# Patient Record
Sex: Male | Born: 1995 | Race: White | Hispanic: No | Marital: Single | Smoking: Never smoker
Health system: Southern US, Community
[De-identification: ages and names within clinical notes are randomized; demographics above are authoritative.]

## PROBLEM LIST (undated history)

## (undated) DIAGNOSIS — K2970 Gastritis, unspecified, without bleeding: Secondary | ICD-10-CM

## (undated) HISTORY — PX: ESOPHAGOGASTRODUODENOSCOPY ENDOSCOPY: SHX5814

---
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
## 2015-09-06 ENCOUNTER — Emergency Department (HOSPITAL_COMMUNITY)
Admission: EM | Admit: 2015-09-06 | Discharge: 2015-09-06 | Disposition: A | Discharge status: Home / Self Care | Payer: 59 | Attending: Emergency Medicine | Admitting: Emergency Medicine

## 2015-09-06 ENCOUNTER — Emergency Department (HOSPITAL_COMMUNITY): Payer: 59

## 2015-09-06 ENCOUNTER — Encounter (HOSPITAL_COMMUNITY): Payer: Self-pay | Admitting: *Deleted

## 2015-09-06 DIAGNOSIS — J0100 Acute maxillary sinusitis, unspecified: Secondary | ICD-10-CM

## 2015-09-06 DIAGNOSIS — Z792 Long term (current) use of antibiotics: Secondary | ICD-10-CM | POA: Diagnosis not present

## 2015-09-06 DIAGNOSIS — R0981 Nasal congestion: Secondary | ICD-10-CM | POA: Diagnosis present

## 2015-09-06 DIAGNOSIS — Z79899 Other long term (current) drug therapy: Secondary | ICD-10-CM | POA: Diagnosis not present

## 2015-09-06 DIAGNOSIS — K2970 Gastritis, unspecified, without bleeding: Secondary | ICD-10-CM | POA: Diagnosis not present

## 2015-09-06 LAB — CBC WITH DIFFERENTIAL/PLATELET
BASOS PCT: 1 %
Basophils Absolute: 0 10*3/uL (ref 0.0–0.1)
EOS ABS: 0.1 10*3/uL (ref 0.0–0.7)
Eosinophils Relative: 2 %
HEMATOCRIT: 46.6 % (ref 39.0–52.0)
HEMOGLOBIN: 16.5 g/dL (ref 13.0–17.0)
LYMPHS ABS: 2 10*3/uL (ref 0.7–4.0)
Lymphocytes Relative: 39 %
MCH: 30.1 pg (ref 26.0–34.0)
MCHC: 35.4 g/dL (ref 30.0–36.0)
MCV: 85 fL (ref 78.0–100.0)
MONO ABS: 0.6 10*3/uL (ref 0.1–1.0)
MONOS PCT: 12 %
NEUTROS PCT: 48 %
Neutro Abs: 2.5 10*3/uL (ref 1.7–7.7)
Platelets: 193 10*3/uL (ref 150–400)
RBC: 5.48 MIL/uL (ref 4.22–5.81)
RDW: 12.1 % (ref 11.5–15.5)
WBC: 5.3 10*3/uL (ref 4.0–10.5)

## 2015-09-06 LAB — LIPASE, BLOOD: Lipase: 25 U/L (ref 11–51)

## 2015-09-06 LAB — COMPREHENSIVE METABOLIC PANEL
ALBUMIN: 5 g/dL (ref 3.5–5.0)
ALK PHOS: 76 U/L (ref 38–126)
ALT: 16 U/L — ABNORMAL LOW (ref 17–63)
ANION GAP: 8 (ref 5–15)
AST: 21 U/L (ref 15–41)
BILIRUBIN TOTAL: 0.9 mg/dL (ref 0.3–1.2)
BUN: 17 mg/dL (ref 6–20)
CALCIUM: 9.8 mg/dL (ref 8.9–10.3)
CO2: 25 mmol/L (ref 22–32)
Chloride: 105 mmol/L (ref 101–111)
Creatinine, Ser: 1.09 mg/dL (ref 0.61–1.24)
GFR calc non Af Amer: >60 mL/min (ref >60)
Glucose, Bld: 97 mg/dL (ref 65–99)
POTASSIUM: 3.3 mmol/L — AB (ref 3.5–5.1)
SODIUM: 138 mmol/L (ref 135–145)
TOTAL PROTEIN: 8 g/dL (ref 6.5–8.1)

## 2015-09-06 MED ORDER — ONDANSETRON HCL 4 MG/2ML IJ SOLN
4.0000 mg | Freq: Once | INTRAMUSCULAR | Status: AC
  Administered 2015-09-06: 4 mg via INTRAVENOUS
  Filled 2015-09-06: qty 2

## 2015-09-06 MED ORDER — ONDANSETRON HCL 8 MG PO TABS: 8.0000 mg | ORAL_TABLET | ORAL | Status: DC | PRN

## 2015-09-06 MED ORDER — DOXYCYCLINE HYCLATE 100 MG PO CAPS: 100.0000 mg | ORAL_CAPSULE | Freq: Two times a day (BID) | ORAL | Status: DC

## 2015-09-06 MED ORDER — SODIUM CHLORIDE 0.9 % IV BOLUS (SEPSIS)
1000.0000 mL | Freq: Once | INTRAVENOUS | Status: AC
  Administered 2015-09-06: 1000 mL via INTRAVENOUS

## 2015-09-06 MED ORDER — METOCLOPRAMIDE HCL 5 MG/ML IJ SOLN
10.0000 mg | Freq: Once | INTRAMUSCULAR | Status: AC
  Administered 2015-09-06: 10 mg via INTRAVENOUS
  Filled 2015-09-06: qty 2

## 2015-09-06 MED ORDER — SODIUM CHLORIDE 0.9 % IV SOLN
Freq: Once | INTRAVENOUS | Status: AC
  Administered 2015-09-06: 10:00:00 via INTRAVENOUS

## 2015-09-06 MED ORDER — PANTOPRAZOLE SODIUM 20 MG PO TBEC: 20.0000 mg | DELAYED_RELEASE_TABLET | Freq: Two times a day (BID) | ORAL | Status: DC

## 2015-09-06 MED ORDER — PANTOPRAZOLE SODIUM 40 MG IV SOLR
40.0000 mg | Freq: Once | INTRAVENOUS | Status: AC
  Administered 2015-09-06: 40 mg via INTRAVENOUS
  Filled 2015-09-06: qty 40

## 2015-09-06 NOTE — ED Provider Notes (Signed)
CSN: [id number]     Arrival date & time 09/06/15  [id number] History   First MD Initiated Contact with Patient 09/06/15 [phone]     Chief Complaint  Patient presents with  . Nasal Congestion  . Emesis     (Consider location/radiation/quality/duration/timing/severity/associated sxs/prior Treatment) HPI...Marland Kitchen.Marland Kitchen.Patient presents with concerns of a "sinus infection" for 2 months. He was evaluated at the urgent care center on Wednesday and prescribed Augmentin. Since then he has been nauseated with vomiting. He is unable to keep fluids down. No fever, sweats, chills, stiff neck, neurodeficits. He is normally healthy. He feels dehydrated.  History reviewed. No pertinent past medical history. History reviewed. No pertinent past surgical history. No family history on file. Social History  Substance Use Topics  . Smoking status: Never Smoker   . Smokeless tobacco: None  . Alcohol Use: No    Review of Systems  All other systems reviewed and are negative.     Allergies  Review of patient's allergies indicates no known allergies.  Home Medications   Prior to Admission medications   Medication Sig Start Date End Date Taking? Authorizing Provider  amoxicillin-clavulanate (AUGMENTIN) 875-125 MG tablet Take 1 tablet by mouth 2 (two) times daily.   Yes Historical Provider, MD  doxycycline (VIBRAMYCIN) 100 MG capsule Take 1 capsule (100 mg total) by mouth 2 (two) times daily. 09/06/15   Donnetta HutchingBrian Nikitas Davtyan, MD  fluticasone [facility](FLONASE) 50 MCG/ACT nasal spray Place 1 spray into both nostrils daily as needed for allergies or rhinitis.   Yes Historical Provider, MD  guaiFENesin (MUCINEX) 600 MG 12 hr tablet Take 600 mg by mouth 2 (two) times daily.   Yes Historical Provider, MD  ondansetron (ZOFRAN) 8 MG tablet Take 1 tablet (8 mg total) by mouth every 4 (four) hours as needed. 09/06/15   Donnetta HutchingBrian Feiga Nadel, MD  pantoprazole (PROTONIX) 20 MG tablet Take 1 tablet (20 mg total) by mouth 2 (two) times daily. 09/06/15   Donnetta HutchingBrian Tashiya Souders, MD    BP 124/78 mmHg  Pulse 51  Temp(Src) 98.2 F (36.8 C) (Oral)  Resp 17  Ht 5\' 7"  (1.702 m)  Wt 173 lb (78.472 kg)  BMI 27.09 kg/m2  SpO2 99% Physical Exam  Constitutional: He is oriented to person, place, and time.  Alert, dry mucous membranes  HENT:  Head: Normocephalic and atraumatic.  Tender over bilateral maxillary sinuses  Eyes: Conjunctivae are normal.  Neck: Neck supple.  Cardiovascular: Normal rate and regular rhythm.   Pulmonary/Chest: Effort normal and breath sounds normal.  Abdominal: Soft. Bowel sounds are normal.  Minimal epigastric tenderness  Musculoskeletal: Normal range of motion.  Neurological: He is alert and oriented to person, place, and time.  Skin: Skin is warm and dry.  Psychiatric: He has a normal mood and affect. His behavior is normal.  Nursing note and vitals reviewed.   ED Course  Procedures (including critical care time) Labs Review Labs Reviewed  COMPREHENSIVE METABOLIC PANEL - Abnormal; Notable for the following:    Potassium 3.3 (*)    ALT 16 (*)    All other components within normal limits  CBC WITH DIFFERENTIAL/PLATELET  LIPASE, BLOOD    Imaging Review Dg Chest 2 View  09/06/2015  CLINICAL DATA:  Cough, fever, sinus infection for 2 months EXAM: CHEST  2 VIEW COMPARISON:  11/16/2008 FINDINGS: Normal heart size, mediastinal contours, and pulmonary vascularity. Lungs clear. No pleural effusion or pneumothorax. Bones unremarkable. IMPRESSION: Normal exam. Electronically Signed   By: Ulyses SouthwardMark  Boles M.D.   On:  09/06/2015 10:50   I have personally reviewed and evaluated these images and lab results as part of my medical decision-making.   EKG Interpretation None      MDM   Final diagnoses:  Gastritis  Acute maxillary sinusitis, recurrence not specified    I think  Augmentin has exacerbated gastritis symptoms. He feels better after IV fluids, IV Zofran, IV Protonix. Will change antibiotic from Augmentin to doxycycline 100 mg.  Also Rx  for Zofran 8 mg and protonix 20 mg twice a day.  This was discussed with the patient and his family.    Donnetta Hutching, MD 09/06/15 [phone]

## 2015-09-06 NOTE — Discharge Instructions (Signed)
Prescription for antibiotic, nausea medicine, antiulcer medication. Increase fluids. Return if worse.

## 2015-09-06 NOTE — ED Notes (Signed)
Pt comes in with nasal/chest congestion starting 2 months ago. Pt says this has progressively gotten worse. He went to urgent care on Wednesday and was prescribed augmentin and mucinex. Pt has been taking this but states he has been having episodes of vomiting in the morning shortly after getting up. Pt states his nausea gets better throughout the day.

## 2015-09-07 ENCOUNTER — Emergency Department (HOSPITAL_COMMUNITY): Payer: 59

## 2015-09-07 ENCOUNTER — Emergency Department (HOSPITAL_COMMUNITY)
Admission: EM | Admit: 2015-09-07 | Discharge: 2015-09-07 | Disposition: A | Discharge status: Home / Self Care | Payer: 59 | Attending: Emergency Medicine | Admitting: Emergency Medicine

## 2015-09-07 ENCOUNTER — Encounter (HOSPITAL_COMMUNITY): Payer: Self-pay | Admitting: Emergency Medicine

## 2015-09-07 DIAGNOSIS — Z79899 Other long term (current) drug therapy: Secondary | ICD-10-CM | POA: Insufficient documentation

## 2015-09-07 DIAGNOSIS — K2970 Gastritis, unspecified, without bleeding: Secondary | ICD-10-CM | POA: Insufficient documentation

## 2015-09-07 DIAGNOSIS — R1013 Epigastric pain: Secondary | ICD-10-CM | POA: Diagnosis present

## 2015-09-07 DIAGNOSIS — K2100 Gastro-esophageal reflux disease with esophagitis, without bleeding: Secondary | ICD-10-CM

## 2015-09-07 LAB — COMPREHENSIVE METABOLIC PANEL
ALK PHOS: 74 U/L (ref 38–126)
ALT: 16 U/L — AB (ref 17–63)
AST: 20 U/L (ref 15–41)
Albumin: 4.7 g/dL (ref 3.5–5.0)
Anion gap: 7 (ref 5–15)
BILIRUBIN TOTAL: 1.3 mg/dL — AB (ref 0.3–1.2)
BUN: 15 mg/dL (ref 6–20)
CO2: 25 mmol/L (ref 22–32)
Calcium: 9.6 mg/dL (ref 8.9–10.3)
Chloride: 106 mmol/L (ref 101–111)
Creatinine, Ser: 1.15 mg/dL (ref 0.61–1.24)
GLUCOSE: 98 mg/dL (ref 65–99)
Potassium: 3.8 mmol/L (ref 3.5–5.1)
Sodium: 138 mmol/L (ref 135–145)
TOTAL PROTEIN: 7.6 g/dL (ref 6.5–8.1)

## 2015-09-07 LAB — URINALYSIS, ROUTINE W REFLEX MICROSCOPIC
Bilirubin Urine: NEGATIVE
GLUCOSE, UA: NEGATIVE mg/dL
HGB URINE DIPSTICK: NEGATIVE
Ketones, ur: 15 mg/dL — AB
LEUKOCYTES UA: NEGATIVE
Nitrite: NEGATIVE
PH: 6.5 (ref 5.0–8.0)
PROTEIN: NEGATIVE mg/dL
Specific Gravity, Urine: <1.005 — ABNORMAL LOW (ref 1.005–1.030)

## 2015-09-07 LAB — CBC WITH DIFFERENTIAL/PLATELET
Basophils Absolute: 0 10*3/uL (ref 0.0–0.1)
Basophils Relative: 0 %
EOS PCT: 0 %
Eosinophils Absolute: 0 10*3/uL (ref 0.0–0.7)
HEMATOCRIT: 44.5 % (ref 39.0–52.0)
Hemoglobin: 15.7 g/dL (ref 13.0–17.0)
LYMPHS ABS: 1.4 10*3/uL (ref 0.7–4.0)
LYMPHS PCT: 26 %
MCH: 29.7 pg (ref 26.0–34.0)
MCHC: 35.3 g/dL (ref 30.0–36.0)
MCV: 84.3 fL (ref 78.0–100.0)
Monocytes Absolute: 0.4 10*3/uL (ref 0.1–1.0)
Monocytes Relative: 8 %
Neutro Abs: 3.6 10*3/uL (ref 1.7–7.7)
Neutrophils Relative %: 66 %
PLATELETS: 190 10*3/uL (ref 150–400)
RBC: 5.28 MIL/uL (ref 4.22–5.81)
RDW: 12 % (ref 11.5–15.5)
WBC: 5.4 10*3/uL (ref 4.0–10.5)

## 2015-09-07 LAB — LIPASE, BLOOD: Lipase: 21 U/L (ref 11–51)

## 2015-09-07 MED ORDER — PROMETHAZINE HCL 25 MG PO TABS: 25.0000 mg | ORAL_TABLET | Freq: Four times a day (QID) | ORAL | Status: DC | PRN

## 2015-09-07 MED ORDER — IOPAMIDOL (ISOVUE-300) INJECTION 61%
100.0000 mL | Freq: Once | INTRAVENOUS | Status: AC | PRN
  Administered 2015-09-07: 100 mL via INTRAVENOUS

## 2015-09-07 MED ORDER — SODIUM CHLORIDE 0.9 % IV BOLUS (SEPSIS)
1000.0000 mL | Freq: Once | INTRAVENOUS | Status: AC
  Administered 2015-09-07: 1000 mL via INTRAVENOUS

## 2015-09-07 MED ORDER — PANTOPRAZOLE SODIUM 40 MG IV SOLR
40.0000 mg | Freq: Once | INTRAVENOUS | Status: AC
  Administered 2015-09-07: 40 mg via INTRAVENOUS
  Filled 2015-09-07: qty 40

## 2015-09-07 MED ORDER — ONDANSETRON HCL 4 MG/2ML IJ SOLN
4.0000 mg | Freq: Once | INTRAMUSCULAR | Status: AC
  Administered 2015-09-07: 4 mg via INTRAVENOUS
  Filled 2015-09-07: qty 2

## 2015-09-07 MED ORDER — DIATRIZOATE MEGLUMINE & SODIUM 66-10 % PO SOLN
ORAL | Status: AC
  Filled 2015-09-07: qty 30

## 2015-09-07 MED ORDER — PROMETHAZINE HCL 25 MG/ML IJ SOLN
12.5000 mg | Freq: Once | INTRAMUSCULAR | Status: AC
  Administered 2015-09-07: 12.5 mg via INTRAVENOUS
  Filled 2015-09-07: qty 1

## 2015-09-07 NOTE — ED Provider Notes (Signed)
History  By signing my name below, I, Earmon Phoenix, attest that this documentation has been prepared under the direction and in the presence of Donnetta Hutching, MD. Electronically Signed: Earmon Phoenix, ED Scribe. 09/07/2015. 12:30 PM  Chief Complaint  Patient presents with  . Abdominal Pain   The history is provided by the patient and medical records. No language interpreter was used.    HPI Comments:  Jake Carr is a 20 y.o. male who presents to the Emergency Department complaining of nausea and epigastric pain that began again about 5 hours ago. He reports associated vomiting. He describes the pain as burning. Pt was evaluated here yesterday for nasal congestion, nausea and emesis. He was previously prescribed Augmentin but was changed to Doxycycline yesterday due to suspected gastritis exacerbation. Protonix and Zofran were also prescribed at yesterday's visit. Pt states he has been taking the medications as prescribed with the last dose being about four hours ago. He reports vomiting the medication up about an hour later. Pt states he was able to eat a small amount yesterday evening without vomiting. He denies modifying factors. He denies difficulty urinating, fever, chills, flank pain. Pt has an appt to establish care with a PCP, Dr. Rodrigo Ran, in four months because that was the first available appt.  History reviewed. No pertinent past medical history. History reviewed. No pertinent past surgical history. History reviewed. No pertinent family history. Social History  Substance Use Topics  . Smoking status: Never Smoker   . Smokeless tobacco: None  . Alcohol Use: No    Review of Systems A complete 10 system review of systems was obtained and all systems are negative except as noted in the HPI and PMH.   Allergies  Review of patient's allergies indicates no known allergies.  Home Medications   Prior to Admission medications   Medication Sig Start Date End Date  Taking? Authorizing Provider  amoxicillin-clavulanate (AUGMENTIN) 875-125 MG tablet Take 1 tablet by mouth 2 (two) times daily.   Yes Historical Provider, MD  doxycycline (VIBRAMYCIN) 100 MG capsule Take 1 capsule (100 mg total) by mouth 2 (two) times daily. 09/06/15  Yes Donnetta Hutching, MD  fluticasone [facility]) 50 MCG/ACT nasal spray Place 1 spray into both nostrils daily as needed for allergies or rhinitis.   Yes Historical Provider, MD  guaiFENesin (MUCINEX) 600 MG 12 hr tablet Take 600 mg by mouth 2 (two) times daily.   Yes Historical Provider, MD  ondansetron (ZOFRAN) 8 MG tablet Take 1 tablet (8 mg total) by mouth every 4 (four) hours as needed. 09/06/15  Yes Donnetta Hutching, MD  pantoprazole (PROTONIX) 20 MG tablet Take 1 tablet (20 mg total) by mouth 2 (two) times daily. 09/06/15  Yes Donnetta Hutching, MD  Probiotic Product ([facility] [employer] HEALTH PO) Take 1 capsule by mouth daily.   Yes Historical Provider, MD  promethazine (PHENERGAN) 25 MG tablet Take 1 tablet (25 mg total) by mouth every 6 (six) hours as needed. 09/07/15   Donnetta Hutching, MD   Triage Vitals: BP 134/87 mmHg  Pulse 64  Temp(Src) 97.7 F (36.5 C) (Oral)  Resp 18  SpO2 99% Physical Exam  Constitutional: He is oriented to person, place, and time. He appears well-developed and well-nourished.  HENT:  Head: Normocephalic and atraumatic.  Eyes: Conjunctivae are normal.  Neck: Neck supple.  Cardiovascular: Normal rate and regular rhythm.   Pulmonary/Chest: Effort normal and breath sounds normal.  Abdominal: Soft. Bowel sounds are normal. There is tenderness in the epigastric  area. There is no CVA tenderness.  Musculoskeletal: Normal range of motion.  Neurological: He is alert and oriented to person, place, and time.  Skin: Skin is warm and dry.  Psychiatric: He has a normal mood and affect. His behavior is normal.  Nursing note and vitals reviewed.   ED Course  Procedures (including critical care time) DIAGNOSTIC STUDIES: Oxygen  Saturation is 99% on RA, normal by my interpretation.   COORDINATION OF CARE: 10:18 AM- Will order IV fluids, labs and CT abdomen. Pt verbalizes understanding and agrees to plan.  Medications  diatrizoate meglumine-sodium (GASTROGRAFIN) 66-10 % solution (not administered)  sodium chloride 0.9 % bolus 1,000 mL (1,000 mLs Intravenous New Bag/Given 09/07/15 1053)  ondansetron (ZOFRAN) injection 4 mg (4 mg Intravenous Given 09/07/15 1053)  pantoprazole (PROTONIX) injection 40 mg (40 mg Intravenous Given 09/07/15 1053)  iopamidol (ISOVUE-300) 61 % injection 100 mL (100 mLs Intravenous Contrast Given 09/07/15 1154)  promethazine (PHENERGAN) injection 12.5 mg (12.5 mg Intravenous Given 09/07/15 1203)    Labs Review Labs Reviewed  COMPREHENSIVE METABOLIC PANEL - Abnormal; Notable for the following:    ALT 16 (*)    Total Bilirubin 1.3 (*)    All other components within normal limits  URINALYSIS, ROUTINE W REFLEX MICROSCOPIC (NOT AT [facility]) - Abnormal; Notable for the following:    Specific Gravity, Urine <1.005 (*)    Ketones, ur 15 (*)    All other components within normal limits  CBC WITH DIFFERENTIAL/PLATELET  LIPASE, BLOOD    Imaging Review Dg Chest 2 View  09/06/2015  CLINICAL DATA:  Cough, fever, sinus infection for 2 months EXAM: CHEST  2 VIEW COMPARISON:  11/16/2008 FINDINGS: Normal heart size, mediastinal contours, and pulmonary vascularity. Lungs clear. No pleural effusion or pneumothorax. Bones unremarkable. IMPRESSION: Normal exam. Electronically Signed   By: Ulyses Southward M.D.   On: 09/06/2015 10:50   Ct Abdomen Pelvis W Contrast  09/07/2015  CLINICAL DATA:  Epigastric pain with nausea and vomiting for 4 days EXAM: CT ABDOMEN AND PELVIS WITH CONTRAST TECHNIQUE: Multidetector CT imaging of the abdomen and pelvis was performed using the standard protocol following bolus administration of intravenous contrast. Oral contrast was also administered. CONTRAST:  ISOVUE-300 IOPAMIDOL (ISOVUE-300)  INJECTION 61% COMPARISON:  Abdominal ultrasound November 22, 2008 FINDINGS: Lower chest:  Lung bases are clear. Hepatobiliary: No focal liver lesions are evident. Gallbladder wall is not appreciably thickened. There is no biliary duct dilatation. Pancreas: There is no pancreatic mass or inflammatory focus. Spleen: No adrenal lesions are evident. Adrenals/Urinary Tract: Adrenals appear normal bilaterally. Kidneys bilaterally show no appreciable mass or hydronephrosis on either side. There is no renal or ureteral calculus on either side. Urinary bladder is midline with wall thickness within normal limits. Stomach/Bowel: There are scattered diverticula throughout the colon without diverticulitis. There is no bowel wall or mesenteric thickening. No bowel obstruction. No free air or portal venous air. Vascular/Lymphatic: There is no abdominal aortic aneurysm. No vascular lesions are evident. There is no appreciable adenopathy in the abdomen or pelvis. Reproductive: Prostate and seminal vesicles appear unremarkable. There is no pelvic mass or pelvic fluid collection. Other: Appendix appears normal. There is no abscess or ascites in the abdomen or pelvis. Musculoskeletal: There are no blastic or lytic bone lesions. There is no intramuscular or abdominal wall lesion. IMPRESSION: Scattered colonic diverticula without diverticulitis. There is no bowel wall thickening or bowel obstruction. No abscess. Appendix appears normal. No renal or ureteral calculus. No hydronephrosis. A cause for  patient's symptoms has not been established with this study. Electronically Signed   By: Bretta BangWilliam  Woodruff III M.D.   On: 09/07/2015 12:06   I have personally reviewed and evaluated these images and lab results as part of my medical decision-making.   EKG Interpretation None      MDM   Final diagnoses:  Gastritis  Gastroesophageal reflux disease with esophagitis    Patient is hemodynamically stable. Labs are pristine. CT scan  shows no acute findings. History and physical consistent with gastritis and GERD. Recommend Phenergan 25 mg in lieu of Zofran. Will follow-up with gastroenterologist. Discussed with patient and his parents  I personally performed the services described in this documentation, which was scribed in my presence. The recorded information has been reviewed and is accurate.      Donnetta HutchingBrian Cairo Agostinelli, MD 09/07/15 [phone]

## 2015-09-07 NOTE — ED Notes (Signed)
MD at bedside. 

## 2015-09-07 NOTE — Discharge Instructions (Signed)
Continue to take previous medications. Try Phenergan rather than Zofran. Follow-up with gastroenterologist if not getting better.

## 2015-09-07 NOTE — ED Notes (Signed)
PAtient complaining of nausea. Vo given from Dr. Adriana Simasook.

## 2015-09-07 NOTE — ED Notes (Signed)
Patient c/o increase in abd pain and no improvement in vomiting after being seen here in ER yesterday. Per patient took zofran and still had no relief in vomiting.

## 2015-09-07 NOTE — ED Notes (Signed)
Drinking po contrast. Tolerating well. 

## 2015-09-09 ENCOUNTER — Other Ambulatory Visit: Payer: Self-pay | Admitting: Gastroenterology

## 2015-09-09 DIAGNOSIS — R1013 Epigastric pain: Secondary | ICD-10-CM

## 2015-09-09 DIAGNOSIS — R110 Nausea: Secondary | ICD-10-CM

## 2015-09-12 ENCOUNTER — Ambulatory Visit
Admission: RE | Admit: 2015-09-12 | Discharge: 2015-09-12 | Disposition: A | Discharge status: Home / Self Care | Payer: 59 | Source: Ambulatory Visit | Attending: Gastroenterology | Admitting: Gastroenterology

## 2015-09-12 DIAGNOSIS — R1013 Epigastric pain: Secondary | ICD-10-CM

## 2015-09-12 DIAGNOSIS — R110 Nausea: Secondary | ICD-10-CM

## 2015-09-15 ENCOUNTER — Emergency Department (HOSPITAL_COMMUNITY): Payer: 59

## 2015-09-15 ENCOUNTER — Encounter (HOSPITAL_COMMUNITY): Payer: Self-pay | Admitting: Emergency Medicine

## 2015-09-15 ENCOUNTER — Emergency Department (HOSPITAL_COMMUNITY)
Admission: EM | Admit: 2015-09-15 | Discharge: 2015-09-15 | Disposition: A | Discharge status: Home / Self Care | Payer: 59 | Attending: Emergency Medicine | Admitting: Emergency Medicine

## 2015-09-15 DIAGNOSIS — F419 Anxiety disorder, unspecified: Secondary | ICD-10-CM | POA: Diagnosis not present

## 2015-09-15 DIAGNOSIS — Z79899 Other long term (current) drug therapy: Secondary | ICD-10-CM | POA: Diagnosis not present

## 2015-09-15 DIAGNOSIS — Z792 Long term (current) use of antibiotics: Secondary | ICD-10-CM | POA: Diagnosis not present

## 2015-09-15 DIAGNOSIS — R112 Nausea with vomiting, unspecified: Secondary | ICD-10-CM | POA: Insufficient documentation

## 2015-09-15 DIAGNOSIS — R1013 Epigastric pain: Secondary | ICD-10-CM | POA: Diagnosis not present

## 2015-09-15 DIAGNOSIS — F1210 Cannabis abuse, uncomplicated: Secondary | ICD-10-CM | POA: Diagnosis not present

## 2015-09-15 HISTORY — DX: Gastritis, unspecified, without bleeding: K29.70

## 2015-09-15 LAB — URINALYSIS, ROUTINE W REFLEX MICROSCOPIC
BILIRUBIN URINE: NEGATIVE
GLUCOSE, UA: NEGATIVE mg/dL
HGB URINE DIPSTICK: NEGATIVE
Ketones, ur: 15 mg/dL — AB
Leukocytes, UA: NEGATIVE
Nitrite: NEGATIVE
Protein, ur: NEGATIVE mg/dL
SPECIFIC GRAVITY, URINE: 1.01 (ref 1.005–1.030)
pH: 6 (ref 5.0–8.0)

## 2015-09-15 LAB — RAPID URINE DRUG SCREEN, HOSP PERFORMED
AMPHETAMINES: NOT DETECTED
Barbiturates: NOT DETECTED
Benzodiazepines: NOT DETECTED
Cocaine: NOT DETECTED
OPIATES: NOT DETECTED
TETRAHYDROCANNABINOL: POSITIVE — AB

## 2015-09-15 LAB — COMPREHENSIVE METABOLIC PANEL
ALBUMIN: 5.2 g/dL — AB (ref 3.5–5.0)
ALT: 17 U/L (ref 17–63)
ANION GAP: 10 (ref 5–15)
AST: 24 U/L (ref 15–41)
Alkaline Phosphatase: 83 U/L (ref 38–126)
BUN: 18 mg/dL (ref 6–20)
CHLORIDE: 105 mmol/L (ref 101–111)
CO2: 23 mmol/L (ref 22–32)
Calcium: 10 mg/dL (ref 8.9–10.3)
Creatinine, Ser: 1.3 mg/dL — ABNORMAL HIGH (ref 0.61–1.24)
GFR calc non Af Amer: >60 mL/min (ref >60)
GLUCOSE: 95 mg/dL (ref 65–99)
POTASSIUM: 3.3 mmol/L — AB (ref 3.5–5.1)
SODIUM: 138 mmol/L (ref 135–145)
Total Bilirubin: 1.5 mg/dL — ABNORMAL HIGH (ref 0.3–1.2)
Total Protein: 8.2 g/dL — ABNORMAL HIGH (ref 6.5–8.1)

## 2015-09-15 LAB — LIPASE, BLOOD: LIPASE: 27 U/L (ref 11–51)

## 2015-09-15 LAB — CBC
HEMATOCRIT: 47.8 % (ref 39.0–52.0)
HEMOGLOBIN: 17.4 g/dL — AB (ref 13.0–17.0)
MCH: 29.8 pg (ref 26.0–34.0)
MCHC: 36.4 g/dL — AB (ref 30.0–36.0)
MCV: 81.8 fL (ref 78.0–100.0)
PLATELETS: 212 10*3/uL (ref 150–400)
RBC: 5.84 MIL/uL — AB (ref 4.22–5.81)
RDW: 12.2 % (ref 11.5–15.5)
WBC: 7.3 10*3/uL (ref 4.0–10.5)

## 2015-09-15 LAB — D-DIMER, QUANTITATIVE: D-Dimer, Quant: 0.35 ug/mL-FEU (ref 0.00–0.50)

## 2015-09-15 LAB — CK: CK TOTAL: 102 U/L (ref 49–397)

## 2015-09-15 LAB — TROPONIN I

## 2015-09-15 MED ORDER — LORAZEPAM 2 MG/ML IJ SOLN
1.0000 mg | Freq: Once | INTRAMUSCULAR | Status: AC
  Administered 2015-09-15: 1 mg via INTRAVENOUS
  Filled 2015-09-15: qty 1

## 2015-09-15 MED ORDER — SODIUM CHLORIDE 0.9 % IV BOLUS (SEPSIS)
1000.0000 mL | Freq: Once | INTRAVENOUS | Status: AC
  Administered 2015-09-15: 1000 mL via INTRAVENOUS

## 2015-09-15 MED ORDER — ONDANSETRON HCL 4 MG/2ML IJ SOLN
4.0000 mg | Freq: Once | INTRAMUSCULAR | Status: AC
  Administered 2015-09-15: 4 mg via INTRAVENOUS
  Filled 2015-09-15: qty 2

## 2015-09-15 MED ORDER — PANTOPRAZOLE SODIUM 20 MG PO TBEC: 20.0000 mg | DELAYED_RELEASE_TABLET | Freq: Two times a day (BID) | ORAL | Status: DC

## 2015-09-15 MED ORDER — FAMOTIDINE IN NACL 20-0.9 MG/50ML-% IV SOLN
20.0000 mg | Freq: Two times a day (BID) | INTRAVENOUS | Status: DC
  Administered 2015-09-15: 20 mg via INTRAVENOUS
  Filled 2015-09-15: qty 50

## 2015-09-15 MED ORDER — LORAZEPAM 1 MG PO TABS: 1.0000 mg | ORAL_TABLET | Freq: Three times a day (TID) | ORAL | Status: DC | PRN

## 2015-09-15 MED ORDER — GI COCKTAIL ~~LOC~~
30.0000 mL | Freq: Once | ORAL | Status: AC
  Administered 2015-09-15: 30 mL via ORAL
  Filled 2015-09-15: qty 30

## 2015-09-15 NOTE — ED Provider Notes (Addendum)
CSN: [id number]     Arrival date & time 09/15/15  1615 History   First MD Initiated Contact with Patient 09/15/15 1804     Chief Complaint  Patient presents with  . Emesis     (Consider location/radiation/quality/duration/timing/severity/associated sxs/prior Treatment) HPI Comments: Patient here with parents. They state he has been unwell since approximately July 5 with epigastric pain nausea and vomiting. He's been seen in the emergency Department as well as the gastroenterology office and has had a negative workup including CT scan and ultrasound. Had an EGD 4 days ago that showed gastritis and is scheduled for a HIDA scan tomorrow. Patient reports ongoing epigastric pain rating to the chest associated with nausea and vomiting and unable to tolerate any medications at home. Has had intermittent green loose stools. Everything started when he was prescribed Augmentin for sinusitis by urgent care which was changed to doxycycline during his last ED visit. He has 3 doxycycline pills left. Patient reports is only able to eat a small amount of food without vomiting. His father reports he has continued to vomit phlegm. There is no known fever, chills or flank pain. He does not have a PCP but has seen Dr. Loreta Ave of gastroenterology. He endorses intermittent marijuana use last used about one week ago. Recently discharged from the [facility] but has not been out of the country in the past 8 months. Denies any regular alcohol use. No illicit pill or injection drug use.  Patient is a 20 y.o. male presenting with vomiting. The history is provided by the patient and a relative.  Emesis Associated symptoms: abdominal pain   Associated symptoms: no arthralgias, no diarrhea and no myalgias     Past Medical History  Diagnosis Date  . Gastritis    Past Surgical History  Procedure Laterality Date  . Esophagogastroduodenoscopy endoscopy     History reviewed. No pertinent family history. Social History   Substance Use Topics  . Smoking status: Never Smoker   . Smokeless tobacco: None  . Alcohol Use: No    Review of Systems  Constitutional: Positive for activity change, appetite change and fatigue. Negative for fever.  HENT: Negative for drooling and rhinorrhea.   Respiratory: Negative for cough, chest tightness and shortness of breath.   Cardiovascular: Negative for chest pain and palpitations.  Gastrointestinal: Positive for nausea, vomiting and abdominal pain. Negative for diarrhea.  Genitourinary: Negative for testicular pain.  Musculoskeletal: Negative for myalgias and arthralgias.  A complete 10 system review of systems was obtained and all systems are negative except as noted in the HPI and PMH.      Allergies  Review of patient's allergies indicates no known allergies.  Home Medications   Prior to Admission medications   Medication Sig Start Date End Date Taking? Authorizing Provider  doxycycline (VIBRAMYCIN) 100 MG capsule Take 1 capsule (100 mg total) by mouth 2 (two) times daily. 09/06/15  Yes Donnetta Hutching, MD  fluticasone [facility]) 50 MCG/ACT nasal spray Place 1 spray into both nostrils daily as needed for allergies or rhinitis.   Yes Historical Provider, MD  guaiFENesin (MUCINEX) 600 MG 12 hr tablet Take 600 mg by mouth 2 (two) times daily.   Yes Historical Provider, MD  ondansetron (ZOFRAN-ODT) 8 MG disintegrating tablet Take 8 mg by mouth every 8 (eight) hours as needed for nausea or vomiting.   Yes Historical Provider, MD  pantoprazole (PROTONIX) 20 MG tablet Take 1 tablet (20 mg total) by mouth 2 (two) times daily. 09/06/15  Yes Donnetta Hutching, MD  Probiotic Product [facility] IMMUNE HEALTH PO) Take 1 capsule by mouth daily. UltraFlora IB   Yes Historical Provider, MD  amoxicillin-clavulanate (AUGMENTIN) 875-125 MG tablet Take 1 tablet by mouth 2 (two) times daily.    Historical Provider, MD  ondansetron (ZOFRAN) 8 MG tablet Take 1 tablet (8 mg total) by mouth every 4 (four)  hours as needed. Patient not taking: Reported on 09/15/2015 09/06/15   Donnetta Hutching, MD  promethazine (PHENERGAN) 25 MG tablet Take 1 tablet (25 mg total) by mouth every 6 (six) hours as needed. Patient not taking: Reported on 09/15/2015 09/07/15   Donnetta Hutching, MD   BP 154/114 mmHg  Pulse 135  Temp(Src) 98.5 F (36.9 C) (Oral)  Resp 20  Ht  (1.702 m)  Wt 156 lb (70.761 kg)  BMI 24.43 kg/m2  SpO2 99% Physical Exam  Constitutional: He is oriented to person, place, and time. He appears well-developed and well-nourished. No distress.  Anxious appearing, tachycardic flushed  HENT:  Head: Normocephalic and atraumatic.  Mouth/Throat: Oropharynx is clear and moist. No oropharyngeal exudate.  Eyes: Conjunctivae and EOM are normal. Pupils are equal, round, and reactive to light.  Neck: Normal range of motion. Neck supple.  No meningismus.  Cardiovascular: Normal rate, regular rhythm, normal heart sounds and intact distal pulses.   No murmur heard. Tachycardic 100-130s, variable  Pulmonary/Chest: Effort normal and breath sounds normal. No respiratory distress. He exhibits no tenderness.  Abdominal: Soft. There is tenderness. There is no rebound and no guarding.  Epigastric tenderness, no guarding or rebound  Musculoskeletal: Normal range of motion. He exhibits no edema or tenderness.  Neurological: He is alert and oriented to person, place, and time. No cranial nerve deficit. He exhibits normal muscle tone. Coordination normal.  No ataxia on finger to nose bilaterally. No pronator drift. 5/5 strength throughout. CN 2-12 intact.Equal grip strength. Sensation intact.   Skin: Skin is warm.  Psychiatric: He has a normal mood and affect. His behavior is normal.  Nursing note and vitals reviewed.   ED Course  Procedures (including critical care time) Labs Review Labs Reviewed  COMPREHENSIVE METABOLIC PANEL - Abnormal; Notable for the following:    Potassium 3.3 (*)    Creatinine, Ser 1.30 (*)     Total Protein 8.2 (*)    Albumin 5.2 (*)    Total Bilirubin 1.5 (*)    All other components within normal limits  CBC - Abnormal; Notable for the following:    RBC 5.84 (*)    Hemoglobin 17.4 (*)    MCHC 36.4 (*)    All other components within normal limits  LIPASE, BLOOD  URINALYSIS, ROUTINE W REFLEX MICROSCOPIC (NOT AT [facility])  URINE RAPID DRUG SCREEN, HOSP PERFORMED    Imaging Review Dg Abd Acute W/chest  09/15/2015  CLINICAL DATA:  Acute abdominal pain and vomiting for 2 weeks. EXAM: DG ABDOMEN ACUTE W/ 1V CHEST COMPARISON:  09/06/2015, 09/07/2015, 09/12/2015 FINDINGS: There is no evidence of dilated bowel loops or free intraperitoneal air. No radiopaque calculi or other significant radiographic abnormality is seen. Heart size and mediastinal contours are within normal limits. Both lungs are clear. Minor scoliosis of the thoracolumbar junction, convex left. IMPRESSION: Negative abdominal radiographs.  No acute cardiopulmonary disease. Electronically Signed   By: Judie Petit.  Shick M.D.   On: 09/15/2015 18:43   I have personally reviewed and evaluated these images and lab results as part of my medical decision-making.   EKG Interpretation  Date/Time:  Monday September 15 2015 18:56:07 EDT Ventricular Rate:  94 PR Interval:    QRS Duration: 88 QT Interval:  376 QTC Calculation: 471 R Axis:   87 Text Interpretation:  Sinus arrhythmia Nonspecific T abnormalities,  inferior leads Borderline prolonged QT interval T waves now upright  laterally inferior T wave inversions and ST depressions Confirmed by  Manus GunningANCOUR  MD, Anjanae Woehrle [phone]([id number]) on 09/15/2015 7:49:24 PM      MDM   Final diagnoses:  Nausea and vomiting, vomiting of unspecified type  Anxiety  Marijuana abuse   Ongoing epigastric pain with nausea and vomiting, recurrent issue. Patient appears dehydrated and tachycardic. Record review shows negative abdominal ultrasound done July 14. Patient reports EGD showed only gastritis. CT scan done  on July 8 was negative for acute pathology.   Patient anxious and tachycardic. Labs showed hemoconcentration and elevated creatinine. IV fluids given with subsequent symptom control.  EKG obtained and shows ST depression and T-wave inversions inferior laterally. No comparison. Denies chest pain. Patient does admit to marijuana use 2 days ago  Patient very anxious and tachycardic. Questionable whether he could be withdrawing from some substance. Denies any benzodiazepine use or excessive alcohol use. States he only drinks occasionally. Admits to only marijuana drug use. Confirms this with parents out of room.  Screen only positive for marijuana. D-dimer negative. TSH normal. Doubt thyroid storm.  EKG reviewed with cardiology fellow Dr. Tarri GlennAzeem.  No comparison. He does not feel this represents ischemia. He states this could be rate related nonspecific T-wave inversions. Or this could be normal variant. There is no evidence of prolonged QT or Brugada syndrome. He does not feel patient needs any further cardiac evaluation with negative troponin. EKG does not suggest HCOM.  Patient tolerating by mouth. No further vomiting. Remains tachycardic and hypertensive and anxious appearing though improved with Ativan. Denies any alcohol or benzodiazepine withdrawal. Denies any other illicit drug use.  Patient now admits that [facility] due to [facility]. However he has not ever been any anxiety medications. Denies any suicidal or homicidal thoughts. Adamantly denies any alcohol or illicit drug use.  Advised to discontinue marijuana use. Follow up with GI as scheduled. He has PCP follow-up on July 31. Suspect his tachycardia may be due to his underlying anxiety. Heart rate has improved to 100 when he is resting. Elevates to 130s when he is talking with staff. Interestingly his heart rate was normal on his previous ED visits this month. Question whether marijuana was laced with another  substance. He is comfortable going home and wishes to do so. He is not tremulous. Doubt life threatening alcohol withdrawal as patient continues to deny any regular alcohol or benzo use.  Last drink "3 months ago".  Has HIDA scan tomorrow.  Will refill PPI. Will given short supply of ativan. Stop doxycycline. Needs to establish with PCP. D/c marijuana use.  Return precautions discussed.  Glynn OctaveStephen Rennie Rouch, MD 09/15/15 [id number]  Glynn OctaveStephen Lexxie Winberg, MD 09/16/15 1113

## 2015-09-15 NOTE — ED Notes (Signed)
Notified by registration that patient's father came to window and states patient feels like he's going to pass out. Bringing patient into triage to obtain new vital signs.

## 2015-09-15 NOTE — ED Notes (Signed)
Patient states he is still vomiting since last visit here earlier this month.

## 2015-09-15 NOTE — Discharge Instructions (Signed)
Nausea and Vomiting Follow up with Dr. Loreta Ave and Dr. Waynard Edwards as scheduled. Stop using marijuana. Return to the ED if you develop new or worsening symptoms. Nausea is a sick feeling that often comes before throwing up (vomiting). Vomiting is a reflex where stomach contents come out of your mouth. Vomiting can cause severe loss of body fluids (dehydration). Children and elderly adults can become dehydrated quickly, especially if they also have diarrhea. Nausea and vomiting are symptoms of a condition or disease. It is important to find the cause of your symptoms. CAUSES   Direct irritation of the stomach lining. This irritation can result from increased acid production (gastroesophageal reflux disease), infection, food poisoning, taking certain medicines (such as nonsteroidal anti-inflammatory drugs), alcohol use, or tobacco use.  Signals from the brain.These signals could be caused by a headache, heat exposure, an inner ear disturbance, increased pressure in the brain from injury, infection, a tumor, or a concussion, pain, emotional stimulus, or metabolic problems.  An obstruction in the gastrointestinal tract (bowel obstruction).  Illnesses such as diabetes, hepatitis, gallbladder problems, appendicitis, kidney problems, cancer, sepsis, atypical symptoms of a heart attack, or eating disorders.  Medical treatments such as chemotherapy and radiation.  Receiving medicine that makes you sleep (general anesthetic) during surgery. DIAGNOSIS Your caregiver may ask for tests to be done if the problems do not improve after a few days. Tests may also be done if symptoms are severe or if the reason for the nausea and vomiting is not clear. Tests may include:  Urine tests.  Blood tests.  Stool tests.  Cultures (to look for evidence of infection).  X-rays or other imaging studies. Test results can help your caregiver make decisions about treatment or the need for additional tests. TREATMENT You need  to stay well hydrated. Drink frequently but in small amounts.You may wish to drink water, sports drinks, clear broth, or eat frozen ice pops or gelatin dessert to help stay hydrated.When you eat, eating slowly may help prevent nausea.There are also some antinausea medicines that may help prevent nausea. HOME CARE INSTRUCTIONS   Take all medicine as directed by your caregiver.  If you do not have an appetite, do not force yourself to eat. However, you must continue to drink fluids.  If you have an appetite, eat a normal diet unless your caregiver tells you differently.  Eat a variety of complex carbohydrates (rice, wheat, potatoes, bread), lean meats, yogurt, fruits, and vegetables.  Avoid high-fat foods because they are more difficult to digest.  Drink enough water and fluids to keep your urine clear or pale yellow.  If you are dehydrated, ask your caregiver for specific rehydration instructions. Signs of dehydration may include:  Severe thirst.  Dry lips and mouth.  Dizziness.  Dark urine.  Decreasing urine frequency and amount.  Confusion.  Rapid breathing or pulse. SEEK IMMEDIATE MEDICAL CARE IF:   You have blood or brown flecks (like coffee grounds) in your vomit.  You have black or bloody stools.  You have a severe headache or stiff neck.  You are confused.  You have severe abdominal pain.  You have chest pain or trouble breathing.  You do not urinate at least once every 8 hours.  You develop cold or clammy skin.  You continue to vomit for longer than 24 to 48 hours.  You have a fever. MAKE SURE YOU:   Understand these instructions.  Will watch your condition.  Will get help right away if you are not doing  well or get worse.   This information is not intended to replace advice given to you by your health care provider. Make sure you discuss any questions you have with your health care provider.   Document Released: 02/15/2005 Document Revised:  05/10/2011 Document Reviewed: 07/15/2010 Elsevier Interactive Patient Education 2016 [employer]  [employer] CorporationCommunity Resource Guide Outpatient Counseling/Substance Abuse Adult The United Ways 211 is a great source of information about community services available.  Access by dialing 2-1-1 from anywhere in [place], or by website -  PooledIncome.plwww.nc211.org.   Other Local Resources (Updated 03/2015)  Crisis Hotlines   Services     Area Served  [facility]  Crisis Hotline, available 24 hours a day, 7 days a week: ([phone] [facility], [place]   [facility]  Crisis Hotline, available 24 hours a day, 7 days a week: [phone] [facility], [place]  [facility]  Suicide Prevention Hotline, available 24 hours a day, 7 days a week: 504-411-6682 [facility], [place]  [facility]   Crisis Hotline, available 24 hours a day, 7 days a week: [phone] [facility], [place]   [facility] Access to [employer] Line  Crisis Hotline, available 24 hours a day, 7 days a week: [phone] All   [facility]  Crisis Hotline, available 24 hours a day, 7 days a week: 437-546-6106(603)646-1316 All   Other Local Resources (Updated 03/2015)  Outpatient Counseling/ Substance Abuse Programs  Services     Address and Phone Number  ADS (Alcohol and Drug Services)   Options include Individual counseling, group counseling, intensive outpatient program (several hours a day, several days a week)  Offers depression assessments  Provides methadone maintenance program [phone] [address], [address] [place], [place] [address]   [facility]   Offers partial hospitalization/day treatment and DUI/DWI programs  [employer]s Medicare, private insurance [phone] [address], [address] [place], [place] [address]  [facility]    Services include intensive outpatient program (several hours a day, several days a week), outpatient treatment, DUI/DWI  services, family education  Also has some services specifically for [employer]  Offers transitional housing  [phone] [address] [place], [place] [address]     [facility]  [employer]cepts Medicare, private pay, and private insurance [phone] [address], [address] [place], [place] [address]  [facility]  Services include individual counseling, substance abuse intensive outpatient program (several hours a day, several days a week), day treatment  Delene Lollccepts Medicare, Medicaid, private insurance [phone] [address], [address] [place], [place] [address]  [facility]   Offers substance abuse intensive outpatient program (several hours a day, several days a week), partial hospitalization program [phone] [address] [place], [place] [address]  [phone] [address] [place], [place] [address]  [phone] [address] [place], [place] [address]  [phone] [address] [place], [place] [address]  [facility]  Individual counseling only  Accepts private insurance only [phone] [address], [address] [place], [place] [address]  [facility]  Methadone maintenance program [phone] [address] [place], [place] [address]  [facility]  Walk-In Clinic providing substance abuse and mental health counseling  Accepts Medicaid, Medicare, private insurance  Offers sliding scale for uninsured [phone] [address] [place], [place]   [facility], [employer].  Offers individual counseling, and intensive in-home services [phone] [address], [address] [place], [place] [address]  [facility] individual counseling, family counseling, group therapy, domestic violence counseling, consumer credit counseling  Accepts Medicare, Medicaid, private insurance  Offers sliding scale  for uninsured [phone] [address] [place], [place] [address]  [phone] [facility], [address] [place], [place] [address]  [facility]  Offers individual, family and group counseling  3 locations - [place], [place], and [place]  [phone]  [address] [place], [place] [address]  [address] [place], [place] [address]  [address] [place], [place] [address]  [facility]    Offers psychiatric assessment, 8-week Intensive Outpatient Program (several hours a day, several times a week, daytime or evenings), early recovery group, family Program, medication management  Private pay or private insurance only [phone], or  [phone] [address] [place], [place] [address]  [facility] [place] [employer] individual, couples and family counseling  Accepts Medicaid, private insurance, and sliding scale for uninsured [phone] [address] [place], [place] [address]  Len Blalock, MD  Individual counseling  Private insurance [phone] [address] [place], [place] [address]  [facility]   Offers assessment, substance abuse treatment, and behavioral health treatment [phone] [address] [place], [place] [address]  [facility]  Individual counseling  Accepts private insurance [phone] [address] [place], [place] [address]  [facility]  Individual counseling  Delene Loll, private insurance [phone] [address] [place], [place] [address]  [facility]    Offers intensive outpatient program (several hours a day, several times a week)  Private pay, private insurance [phone] [facility] [place], [place]  [facility]  Individual counseling  Medicare, private insurance [phone] [address] [place], [place] [address]  [facility]     Offers intensive outpatient program (several hours a day, several times a week) and partial hospitalization program [phone] [address] [place], [place] [address]  Emerson Monte, MD  Individual counseling [phone] [address], [address] [place], [place] [address]  [facility]  Offers Christian counseling to individuals, couples, and families  Accepts Medicare and private insurance; offers sliding scale for uninsured [phone] [address] [place], [place] [address]  [facility]  [place] [facility] [phone] [address], [address] [place], [place] [address]  [facility] crisis counseling, individual counseling, group therapy, in-home therapy, domestic violence services, day treatment, DWI services, Administrator, arts (CST), Assertive Community Treatment Team (ACTT), substance abuse Intensive Outpatient Program (several hours a day, several times a week)  2 locations - [place] and [place] [phone] [address] [place], [place] [address]  [phone] [address] [place], [place] [address]  [facility]     Individual counseling and group therapy  Accepts private insurance, [place], [place] [phone] [address] [place], [place]  [facility]  Offers individual and family counseling  Offers LGBTQ services  Accepts private insurance and private pay [phone] [address] [place], [place] [address]  [facility]    Offers individual counseling, group therapy, and outpatient detox  Accepts private insurance [phone] [address] [place], [place]  [facility]  Individual counseling  Accepts Medicare, private insurance [phone] [address] [place], [place] [address]  [employer]  Individual counseling  [employer], private  insurance [phone] [address] [place], [place] [address]  [employer] [facility]   Offers  substance abuse Intensive Outpatient Program (several hours a day, several times a week) [phone], or [phone] [place], [place]

## 2015-09-16 ENCOUNTER — Encounter (HOSPITAL_COMMUNITY)
Admission: RE | Admit: 2015-09-16 | Discharge: 2015-09-16 | Disposition: A | Discharge status: Home / Self Care | Payer: 59 | Source: Ambulatory Visit | Attending: Gastroenterology | Admitting: Gastroenterology

## 2015-09-16 ENCOUNTER — Encounter (HOSPITAL_COMMUNITY): Payer: Self-pay

## 2015-09-16 DIAGNOSIS — R110 Nausea: Secondary | ICD-10-CM | POA: Insufficient documentation

## 2015-09-16 DIAGNOSIS — R1013 Epigastric pain: Secondary | ICD-10-CM | POA: Diagnosis present

## 2015-09-16 LAB — TSH: TSH: 0.746 u[IU]/mL (ref 0.350–4.500)

## 2015-09-16 MED ORDER — TECHNETIUM TC 99M MEBROFENIN IV KIT
5.0000 | PACK | Freq: Once | INTRAVENOUS | Status: AC | PRN
  Administered 2015-09-16: 5 via INTRAVENOUS

## 2015-09-17 ENCOUNTER — Encounter (HOSPITAL_COMMUNITY): Payer: Self-pay

## 2015-09-17 ENCOUNTER — Observation Stay (HOSPITAL_COMMUNITY)
Admission: EM | Admit: 2015-09-17 | Discharge: 2015-09-19 | Disposition: A | Discharge status: Home / Self Care | Payer: 59 | Attending: Internal Medicine | Admitting: Internal Medicine

## 2015-09-17 DIAGNOSIS — K209 Esophagitis, unspecified: Secondary | ICD-10-CM | POA: Diagnosis not present

## 2015-09-17 DIAGNOSIS — D751 Secondary polycythemia: Secondary | ICD-10-CM | POA: Insufficient documentation

## 2015-09-17 DIAGNOSIS — R1013 Epigastric pain: Secondary | ICD-10-CM | POA: Diagnosis present

## 2015-09-17 DIAGNOSIS — F419 Anxiety disorder, unspecified: Secondary | ICD-10-CM | POA: Insufficient documentation

## 2015-09-17 DIAGNOSIS — R1110 Vomiting, unspecified: Secondary | ICD-10-CM | POA: Diagnosis present

## 2015-09-17 DIAGNOSIS — K2970 Gastritis, unspecified, without bleeding: Secondary | ICD-10-CM | POA: Diagnosis not present

## 2015-09-17 DIAGNOSIS — K828 Other specified diseases of gallbladder: Secondary | ICD-10-CM | POA: Diagnosis not present

## 2015-09-17 DIAGNOSIS — R112 Nausea with vomiting, unspecified: Secondary | ICD-10-CM | POA: Diagnosis not present

## 2015-09-17 DIAGNOSIS — E860 Dehydration: Secondary | ICD-10-CM | POA: Diagnosis not present

## 2015-09-17 DIAGNOSIS — F1290 Cannabis use, unspecified, uncomplicated: Secondary | ICD-10-CM | POA: Insufficient documentation

## 2015-09-17 LAB — URINALYSIS, ROUTINE W REFLEX MICROSCOPIC
Glucose, UA: NEGATIVE mg/dL
Hgb urine dipstick: NEGATIVE
LEUKOCYTES UA: NEGATIVE
NITRITE: NEGATIVE
PH: 6.5 (ref 5.0–8.0)
PROTEIN: NEGATIVE mg/dL
Specific Gravity, Urine: 1.029 (ref 1.005–1.030)

## 2015-09-17 LAB — COMPREHENSIVE METABOLIC PANEL
ALT: 17 U/L (ref 17–63)
ANION GAP: 11 (ref 5–15)
AST: 27 U/L (ref 15–41)
Albumin: 5.3 g/dL — ABNORMAL HIGH (ref 3.5–5.0)
Alkaline Phosphatase: 84 U/L (ref 38–126)
BILIRUBIN TOTAL: 2.5 mg/dL — AB (ref 0.3–1.2)
BUN: 14 mg/dL (ref 6–20)
CHLORIDE: 109 mmol/L (ref 101–111)
CO2: 19 mmol/L — ABNORMAL LOW (ref 22–32)
Calcium: 10.5 mg/dL — ABNORMAL HIGH (ref 8.9–10.3)
Creatinine, Ser: 1.34 mg/dL — ABNORMAL HIGH (ref 0.61–1.24)
Glucose, Bld: 102 mg/dL — ABNORMAL HIGH (ref 65–99)
POTASSIUM: 3.9 mmol/L (ref 3.5–5.1)
Sodium: 139 mmol/L (ref 135–145)
TOTAL PROTEIN: 7.9 g/dL (ref 6.5–8.1)

## 2015-09-17 LAB — CBC
HEMATOCRIT: 47 % (ref 39.0–52.0)
Hemoglobin: 17.1 g/dL — ABNORMAL HIGH (ref 13.0–17.0)
MCH: 30.2 pg (ref 26.0–34.0)
MCHC: 36.4 g/dL — ABNORMAL HIGH (ref 30.0–36.0)
MCV: 82.9 fL (ref 78.0–100.0)
PLATELETS: 233 10*3/uL (ref 150–400)
RBC: 5.67 MIL/uL (ref 4.22–5.81)
RDW: 12.2 % (ref 11.5–15.5)
WBC: 7.1 10*3/uL (ref 4.0–10.5)

## 2015-09-17 LAB — URINE MICROSCOPIC-ADD ON: WBC UA: NONE SEEN WBC/hpf (ref 0–5)

## 2015-09-17 LAB — LIPASE, BLOOD: LIPASE: 27 U/L (ref 11–51)

## 2015-09-17 MED ORDER — SODIUM CHLORIDE 0.9 % IV BOLUS (SEPSIS)
1000.0000 mL | Freq: Once | INTRAVENOUS | Status: AC
Start: 2015-09-17 — End: 2015-09-17
  Administered 2015-09-17: 1000 mL via INTRAVENOUS

## 2015-09-17 MED ORDER — SODIUM CHLORIDE 0.9 % IV BOLUS (SEPSIS)
1000.0000 mL | Freq: Once | INTRAVENOUS | Status: AC
  Administered 2015-09-17: 1000 mL via INTRAVENOUS

## 2015-09-17 MED ORDER — SODIUM CHLORIDE 0.9 % IV SOLN: INTRAVENOUS | Status: DC

## 2015-09-17 MED ORDER — ONDANSETRON HCL 4 MG/2ML IJ SOLN
4.0000 mg | Freq: Four times a day (QID) | INTRAMUSCULAR | Status: DC | PRN
Start: 2015-09-17 — End: 2015-09-19
  Administered 2015-09-18 – 2015-09-19 (×4): 4 mg via INTRAVENOUS
  Filled 2015-09-17 (×3): qty 2

## 2015-09-17 MED ORDER — SODIUM CHLORIDE 0.9 % IV SOLN
INTRAVENOUS | Status: DC
  Administered 2015-09-17 – 2015-09-19 (×3): via INTRAVENOUS

## 2015-09-17 MED ORDER — HYDROCODONE-ACETAMINOPHEN 5-325 MG PO TABS: 1.0000 | ORAL_TABLET | ORAL | Status: DC | PRN

## 2015-09-17 MED ORDER — ONDANSETRON HCL 4 MG/2ML IJ SOLN: 4.0000 mg | Freq: Three times a day (TID) | INTRAMUSCULAR | Status: AC | PRN

## 2015-09-17 MED ORDER — PROMETHAZINE HCL 25 MG/ML IJ SOLN: 25.0000 mg | Freq: Once | INTRAMUSCULAR | Status: DC

## 2015-09-17 MED ORDER — PANTOPRAZOLE SODIUM 40 MG PO TBEC
40.0000 mg | DELAYED_RELEASE_TABLET | Freq: Every day | ORAL | Status: DC
  Administered 2015-09-17 – 2015-09-18 (×2): 40 mg via ORAL
  Filled 2015-09-17 (×3): qty 1

## 2015-09-17 MED ORDER — ONDANSETRON HCL 4 MG PO TABS: 4.0000 mg | ORAL_TABLET | Freq: Four times a day (QID) | ORAL | Status: DC | PRN

## 2015-09-17 NOTE — ED Provider Notes (Signed)
CSN: [id number]     Arrival date & time 09/17/15  1246 History   First MD Initiated Contact with Patient 09/17/15 1503     Chief Complaint  Patient presents with  . epigastric pain   . Emesis    (Consider location/radiation/quality/duration/timing/severity/associated sxs/prior Treatment) HPI Comments: Patient presents with three-week history of continued epigastric pain, vomiting, diarrhea. Patient has had 4 ED visits for the same. He has seen gastroenterologist, Dr. [name], who has performed an EGD showing gastritis. Patient has had a HIDA scan which showed a low normal ejection fraction of 35%. Symptoms at home are uncontrolled despite oral Phenergan, ODT Zofran. Symptoms were uncontrolled today, > 10 episodes of vomting, and patient was concerned about dehydration prompting ED visit. Upcoming surgery appointment to evaluate gallbladder. The onset of this condition was acute. The course is constant. Aggravating factors: none. Alleviating factors: none. Denies recent suspicious water exposures. History of marijuana use.    Patient is a 20 y.o. male presenting with vomiting. The history is provided by the patient and medical records.  Emesis Associated symptoms: abdominal pain and diarrhea   Associated symptoms: no headaches, no myalgias and no sore throat     Past Medical History  Diagnosis Date  . Gastritis    Past Surgical History  Procedure Laterality Date  . Esophagogastroduodenoscopy endoscopy     No family history on file. Social History  Substance Use Topics  . Smoking status: Never Smoker   . Smokeless tobacco: None  . Alcohol Use: No    Review of Systems  Constitutional: Negative for fever.  HENT: Negative for rhinorrhea and sore throat.   Eyes: Negative for redness.  Respiratory: Negative for cough.   Cardiovascular: Negative for chest pain.  Gastrointestinal: Positive for nausea, vomiting, abdominal pain and diarrhea.  Genitourinary: Negative for dysuria.   Musculoskeletal: Negative for myalgias.  Skin: Negative for rash.  Neurological: Negative for headaches.    Allergies  Review of patient's allergies indicates no known allergies.  Home Medications   Prior to Admission medications   Medication Sig Start Date End Date Taking? Authorizing Provider  amoxicillin-clavulanate (AUGMENTIN) 875-125 MG tablet Take 1 tablet by mouth 2 (two) times daily.    Historical Provider, MD  doxycycline (VIBRAMYCIN) 100 MG capsule Take 1 capsule (100 mg total) by mouth 2 (two) times daily. 09/06/15   Donnetta HutchingBrian Cook, MD  fluticasone [facility](FLONASE) 50 MCG/ACT nasal spray Place 1 spray into both nostrils daily as needed for allergies or rhinitis.    Historical Provider, MD  guaiFENesin (MUCINEX) 600 MG 12 hr tablet Take 600 mg by mouth 2 (two) times daily.    Historical Provider, MD  LORazepam (ATIVAN) 1 MG tablet Take 1 tablet (1 mg total) by mouth 3 (three) times daily as needed for anxiety. 09/15/15   Glynn OctaveStephen Rancour, MD  ondansetron (ZOFRAN) 8 MG tablet Take 1 tablet (8 mg total) by mouth every 4 (four) hours as needed. Patient not taking: Reported on 09/15/2015 09/06/15   Donnetta HutchingBrian Cook, MD  ondansetron (ZOFRAN-ODT) 8 MG disintegrating tablet Take 8 mg by mouth every 8 (eight) hours as needed for nausea or vomiting.    Historical Provider, MD  pantoprazole (PROTONIX) 20 MG tablet Take 1 tablet (20 mg total) by mouth 2 (two) times daily. 09/15/15   Glynn OctaveStephen Rancour, MD  Probiotic Product [facility](ULTRAFLORA IMMUNE HEALTH PO) Take 1 capsule by mouth daily. UltraFlora IB    Historical Provider, MD  promethazine (PHENERGAN) 25 MG tablet Take 1 tablet (25 mg total)  by mouth every 6 (six) hours as needed. Patient not taking: Reported on 09/15/2015 09/07/15   Donnetta Hutching, MD   BP 140/100 mmHg  Pulse 70  Temp(Src) 98.5 F (36.9 C) (Oral)  Resp 18  Ht 5\' 7"  (1.702 m)  Wt 69.4 kg  BMI 23.96 kg/m2  SpO2 100%   Physical Exam  Constitutional: He appears well-developed and well-nourished.   HENT:  Head: Normocephalic and atraumatic.  Mouth/Throat: Oropharynx is clear and moist.  Eyes: Conjunctivae are normal. Right eye exhibits no discharge. Left eye exhibits no discharge.  Neck: Normal range of motion. Neck supple.  Cardiovascular: Normal rate, regular rhythm and normal heart sounds.   No murmur heard. Pulmonary/Chest: Effort normal and breath sounds normal. No respiratory distress. He has no wheezes. He has no rales.  Abdominal: Soft. There is no tenderness. There is no rebound and no guarding.  Neurological: He is alert.  Skin: Skin is warm and dry.  Psychiatric: He has a normal mood and affect.  Nursing note and vitals reviewed.   ED Course  Procedures (including critical care time) Labs Review Labs Reviewed  COMPREHENSIVE METABOLIC PANEL - Abnormal; Notable for the following:    CO2 19 (*)    Glucose, Bld 102 (*)    Creatinine, Ser 1.34 (*)    Calcium 10.5 (*)    Albumin 5.3 (*)    Total Bilirubin 2.5 (*)    All other components within normal limits  CBC - Abnormal; Notable for the following:    Hemoglobin 17.1 (*)    MCHC 36.4 (*)    All other components within normal limits  URINALYSIS, ROUTINE W REFLEX MICROSCOPIC (NOT AT [facility]) - Abnormal; Notable for the following:    APPearance TURBID (*)    Bilirubin Urine SMALL (*)    Ketones, ur >80 (*)    All other components within normal limits  URINE MICROSCOPIC-ADD ON - Abnormal; Notable for the following:    Squamous Epithelial / LPF 0-5 (*)    Bacteria, UA FEW (*)    All other components within normal limits  LIPASE, BLOOD    Imaging Review Nm Hepato W/eject Fract  09/16/2015  CLINICAL DATA:  Epigastric pain, nausea over the last 2 weeks EXAM: NUCLEAR MEDICINE HEPATOBILIARY IMAGING WITH GALLBLADDER EF TECHNIQUE: Sequential images of the abdomen were obtained out to 60 minutes following intravenous administration of radiopharmaceutical. After oral ingestion of Ensure, gallbladder ejection fraction was  determined. At 60 min, normal ejection fraction is greater than 33%. RADIOPHARMACEUTICALS:  5.0 mCi Tc-80m  Choletec IV COMPARISON:  CT abdomen pelvis of 09/07/2015 and ultrasound of the abdomen of 09/07/2015 FINDINGS: Prompt uptake and biliary excretion of activity by the liver is seen. Gallbladder activity is visualized, consistent with patency of cystic duct. Biliary activity passes into small bowel, consistent with patent common bile duct. Calculated gallbladder ejection fraction is 35%. (Normal gallbladder ejection fraction with Ensure is greater than 33%.) IMPRESSION: Normal nuclear medicine hepatobiliary scan. Low normal gallbladder ejection fraction of 35%. Electronically Signed   By: Dwyane Dee M.D.   On: 09/16/2015 11:39   Dg Abd Acute W/chest  09/15/2015  CLINICAL DATA:  Acute abdominal pain and vomiting for 2 weeks. EXAM: DG ABDOMEN ACUTE W/ 1V CHEST COMPARISON:  09/06/2015, 09/07/2015, 09/12/2015 FINDINGS: There is no evidence of dilated bowel loops or free intraperitoneal air. No radiopaque calculi or other significant radiographic abnormality is seen. Heart size and mediastinal contours are within normal limits. Both lungs are clear. Minor scoliosis  of the thoracolumbar junction, convex left. IMPRESSION: Negative abdominal radiographs.  No acute cardiopulmonary disease. Electronically Signed   By: Judie Petit.  Shick M.D.   On: 09/15/2015 18:43   I have personally reviewed and evaluated these images and lab results as part of my medical decision-making.  3:31 PM Patient seen and examined. Work-up reviewed with patient and parents at bedside. Medications ordered.   Vital signs reviewed and are as follows: BP 140/100 mmHg  Pulse 70  Temp(Src) 98.5 F (36.9 C) (Oral)  Resp 18  Ht  (1.702 m)  Wt 69.4 kg  BMI 23.96 kg/m2  SpO2 100%  5:14 PM Spoke with Dr. Loreta Ave regarding patient. Feels patient would benefit for admission of control of symptoms, surgery consult. Her opinion is that patient  has biliary dyskinesia, chronic cholecystitis.   Spoke with Dr. Dwain Sarna. He is happy to consult on patient. Does not feel that he needs emergent surgery based on labs, HIDA, previous imaging.   Will ask hospitalist for observation admit for symptom control. Family and patient in agreement.   5:29 PM [facility] to see.   MDM   Final diagnoses:  Dehydration  Intractable vomiting with nausea, vomiting of unspecified type  Epigastric pain   Admit for symptom control, surgery consult.     Renne Crigler, PA-C 09/17/15 1729  Bethann Berkshire, MD 09/17/15 [phone]

## 2015-09-17 NOTE — H&P (Signed)
TRH H&P   Patient Demographics:    Jake Carr, is a 20 y.o. male  MRN: [id number]   DOB - 03-Jun-1995  Admit Date - 09/17/2015  Outpatient Primary MD for the patient is Ezequiel Kayser, MD  Referring MD/NP/PA: PA Geiple  Outpatient Specialists: Dr Lore[facility]  Patient coming from: Home  Chief Complaint  Patient presents with  . epigastric pain   . Emesis      HPI:    Jake Carr  is a 20 y.o. male, with significant past medical history of anxiety, sent by GI Dr. Kenna Gilbert office for evaluation of nausea, vomiting and abdominal pain, patient reports that symptoms started about last 3 weeks, pigastric abdominal pain, postprandial, accompanied by nausea and vomiting, endoscopy done at Dr Loreta Ave office  for significant for mild esophagitis and gastritis, CT abdomen and pelvis significant for mild diverticulosis, no acute finding in right upper quadrant ultrasound, patient had HIDA scan on 09/16/2015 significant for Low normal gallbladder ejection fraction of 35%, supposed to follow up with [facility] Dr. Andrey Campanile tomorrow for biliary dyskinesis evaluation, giving significant symptom, he could not wait till tomorrow. - in ED workup significant for slight increase in his creatinine, and hemoconcentration with glycohemoglobin of 17.1, orthostatics is as heart rate increased from 68-97 on standing.    Review of systems:    In addition to the HPI above,  No Fever-chills, No Headache, No changes with Vision or hearing, No problems swallowing food or Liquids, No Chest pain, Cough or Shortness of Breath, complaintsof abdominal pain, nausea, vomiting over the last 3 weeks No Blood in stool or Urine, No dysuria, No new skin rashes or bruises, No new joints pains-aches,  No new weakness, tingling, numbness in any extremity, No recent weight gain or loss, No polyuria, polydypsia or  polyphagia, No significant Mental Stressors,report history of anxiety, recently discharged from [place] [facility] secondary to anxiety.  A full 10 point Review of Systems was done, except as stated above, all other Review of Systems were negative.   With Past History of the following :    Past Medical History  Diagnosis Date  . Gastritis       Past Surgical History  Procedure Laterality Date  . Esophagogastroduodenoscopy endoscopy        Social History:     Social History  Substance Use Topics  . Smoking status: Never Smoker   . Smokeless tobacco: Not on file  . Alcohol Use: No     Lives - Home   Mobility - independent    Family History :  No family history of premature coronary artery disease   Home Medications:   Prior to Admission medications   Medication Sig Start Date End Date Taking? Authorizing Provider  fluticasone (FLONASE) 50 MCG/ACT nasal spray Place 1 spray into both nostrils daily as needed for allergies or rhinitis.   Yes Historical Provider, MD  ondansetron (ZOFRAN-ODT) 8 MG disintegrating tablet Take 8 mg by mouth every 8 (eight) hours as needed for nausea or vomiting.   Yes Historical Provider, MD  pantoprazole (PROTONIX) 20 MG tablet Take 1 tablet (20 mg total) by mouth 2 (two) times daily. Patient taking differently: Take 40 mg by mouth daily.  09/15/15  Yes Glynn OctaveStephen Rancour, MD  Probiotic Product [facility](ULTRAFLORA IMMUNE HEALTH PO) Take 1 capsule by mouth daily. UltraFlora IB   Yes Historical Provider, MD  doxycycline (VIBRAMYCIN) 100 MG capsule Take 1 capsule (100 mg total) by mouth 2 (two) times daily. Patient not taking: Reported on 09/17/2015 09/06/15   Donnetta HutchingBrian Cook, MD  LORazepam (ATIVAN) 1 MG tablet Take 1 tablet (1 mg total) by mouth 3 (three) times daily as needed for anxiety. 09/15/15   Glynn OctaveStephen Rancour, MD     Allergies:    No Known Allergies   Physical Exam:   Vitals  Blood pressure 128/80, pulse 62, temperature 98.5 F (36.9 C), temperature  source Oral, resp. rate 18, height 5\' 7"  (1.702 m), weight 69.4 kg (153 lb), SpO2 99 %.   1. General well-developed young male lying in bed in NAD,    2. Normal affect and insight, Not Suicidal or Homicidal, Awake Alert, Oriented X 3.  3. No F.N deficits, ALL C.Nerves Intact, Strength 5/5 all 4 extremities, Sensation intact all 4 extremities, Plantars down going.  4. Ears and Eyes appear Normal, Conjunctivae clear, PERRLA.Dry  Oral Mucosa.  5. Supple Neck, No JVD, No cervical lymphadenopathy appriciated, No Carotid Bruits.  6. Symmetrical Chest wall movement, Good air movement bilaterally, CTAB.  7. RRR, No Gallops, Rubs or Murmurs, No Parasternal Heave.  8. Positive Bowel Sounds, Abdomen Soft, mild epigastric tenderness, No organomegaly appriciated,No rebound -guarding or rigidity.  9.  No Cyanosis, Normal Skin Turgor, No Skin Rash or Bruise.  10. Good muscle tone,  joints appear normal , no effusions, Normal ROM.  11. No Palpable Lymph Nodes in Neck or Axillae    Data Review:    CBC  Recent Labs Lab 09/15/15 1719 09/17/15 1255  WBC 7.3 7.1  HGB 17.4* 17.1*  HCT 47.8 47.0  PLT 212 233  MCV 81.8 82.9  MCH 29.8 30.2  MCHC 36.4* 36.4*  RDW 12.2 12.2   ------------------------------------------------------------------------------------------------------------------  Chemistries   Recent Labs Lab 09/15/15 1719 09/17/15 1255  NA 138 139  K 3.3* 3.9  CL 105 109  CO2 23 19*  GLUCOSE 95 102*  BUN 18 14  CREATININE 1.30* 1.34*  CALCIUM 10.0 10.5*  AST 24 27  ALT 17 17  ALKPHOS 83 84  BILITOT 1.5* 2.5*   ------------------------------------------------------------------------------------------------------------------ estimated creatinine clearance is 82.9 mL/min (by C-G formula based on Cr of 1.34). ------------------------------------------------------------------------------------------------------------------  Recent Labs  09/15/15 1723  TSH 0.746     Coagulation profile No results for input(s): INR, PROTIME in the last 168 hours. -------------------------------------------------------------------------------------------------------------------  Recent Labs  09/15/15 1719  DDIMER 0.35   -------------------------------------------------------------------------------------------------------------------  Cardiac Enzymes  Recent Labs Lab 09/15/15 1719 09/15/15 2051  TROPONINI <0.03 <0.03   ------------------------------------------------------------------------------------------------------------------ No results found for: BNP   ---------------------------------------------------------------------------------------------------------------  Urinalysis    Component Value Date/Time   COLORURINE YELLOW 09/17/2015 1552   APPEARANCEUR TURBID* 09/17/2015 1552   LABSPEC 1.029 09/17/2015 1552   PHURINE 6.5 09/17/2015 1552   GLUCOSEU NEGATIVE 09/17/2015 1552   HGBUR NEGATIVE 09/17/2015 1552   BILIRUBINUR SMALL* 09/17/2015 1552   KETONESUR >80* 09/17/2015 1552   PROTEINUR NEGATIVE 09/17/2015 1552   UROBILINOGEN  0.2 11/16/2008 0738   NITRITE NEGATIVE 09/17/2015 1552   LEUKOCYTESUR NEGATIVE 09/17/2015 1552    ----------------------------------------------------------------------------------------------------------------   Imaging Results:    Nm Hepato W/eject Fract  09/16/2015  CLINICAL DATA:  Epigastric pain, nausea over the last 2 weeks EXAM: NUCLEAR MEDICINE HEPATOBILIARY IMAGING WITH GALLBLADDER EF TECHNIQUE: Sequential images of the abdomen were obtained out to 60 minutes following intravenous administration of radiopharmaceutical. After oral ingestion of Ensure, gallbladder ejection fraction was determined. At 60 min, normal ejection fraction is greater than 33%. RADIOPHARMACEUTICALS:  5.0 mCi Tc-61m  Choletec IV COMPARISON:  CT abdomen pelvis of 09/07/2015 and ultrasound of the abdomen of 09/07/2015 FINDINGS: Prompt  uptake and biliary excretion of activity by the liver is seen. Gallbladder activity is visualized, consistent with patency of cystic duct. Biliary activity passes into small bowel, consistent with patent common bile duct. Calculated gallbladder ejection fraction is 35%. (Normal gallbladder ejection fraction with Ensure is greater than 33%.) IMPRESSION: Normal nuclear medicine hepatobiliary scan. Low normal gallbladder ejection fraction of 35%. Electronically Signed   By: Dwyane Dee M.D.   On: 09/16/2015 11:39   Dg Abd Acute W/chest  09/15/2015  CLINICAL DATA:  Acute abdominal pain and vomiting for 2 weeks. EXAM: DG ABDOMEN ACUTE W/ 1V CHEST COMPARISON:  09/06/2015, 09/07/2015, 09/12/2015 FINDINGS: There is no evidence of dilated bowel loops or free intraperitoneal air. No radiopaque calculi or other significant radiographic abnormality is seen. Heart size and mediastinal contours are within normal limits. Both lungs are clear. Minor scoliosis of the thoracolumbar junction, convex left. IMPRESSION: Negative abdominal radiographs.  No acute cardiopulmonary disease. Electronically Signed   By: Judie Petit.  Shick M.D.   On: 09/15/2015 18:43      Assessment & Plan:    Active Problems:   Intractable vomiting   Biliary dyskinesia   intractable nausea, vomiting and abdominal pain - patient with extensive workup with GI Dr. Loreta Ave as an outpatient, no acute finding in CT abdomen and pelvis, or abdominal ultrasound, HIDA scan done yesterday significant for low normal gallbladder ejection fraction of 35%, which raises a suspicion for biliary dyskinesis, will continue with clear liquid diet, advance as tolerated, when necessary nausea medication, continue with IV fluids,  Patient was supposed to follow with Dr. Andrey Campanile from [facility] tomorrow, surgery consulted by ED, Dr. Dwain Sarna to see the patient, case discussed with Dr. Loreta Ave, she is having this is most likely related to biliary dyskinesis. - We'll continue with PPI as EGD  showing evidence of mild esophagitis/gastritis - Patient was encouraged to stop usingTHC as it might be contributing to his symptoms.  Polycythemia - This is most likely hemoconcentration from dehydration and vomiting, continue with IV fluids  Dehydration - Patient  With slight bump in his creatinine, clinically dehydrated, continue with IV fluids.  DVT Prophylaxis ambulatory/ SCDs   AM Labs Ordered, also please review Full Orders  Family Communication: Admission, patients condition and plan of care including tests being ordered have been discussed with the patient and father and mother who indicate understanding and agree with the plan and Code Status.  Code Status Full  Likely DC to Home  Condition GUARDED    Consults called: ED called Surgery Dr Dwain Sarna   Admission status: Observation  Time spent in minutes : 50 minutes   Jette Lewan M.D on 09/17/2015 at 6:02 PM  Between 7am to 7pm - Pager - [phone]. After 7pm go to [email or web address] - password [facility]  [facility] - Office  [phone]

## 2015-09-17 NOTE — ED Notes (Signed)
Pt tolerated oral challenge with no episodes of vomiting.  Admitting in room.

## 2015-09-17 NOTE — ED Notes (Signed)
Patient here with ongoing epigastric pain x 3 weeks. Nausea and vomiting with same. Had nuclear medicine study yesterday and gallbladder was functioning at 35%. Has appt. With [facility] tomorrow but patient feels worse. Has lost 18lbs the past 3 weeks.

## 2015-09-18 DIAGNOSIS — R1110 Vomiting, unspecified: Secondary | ICD-10-CM | POA: Diagnosis not present

## 2015-09-18 LAB — CBC
HCT: 41.6 % (ref 39.0–52.0)
HEMOGLOBIN: 14.4 g/dL (ref 13.0–17.0)
MCH: 29.2 pg (ref 26.0–34.0)
MCHC: 34.6 g/dL (ref 30.0–36.0)
MCV: 84.4 fL (ref 78.0–100.0)
PLATELETS: 188 10*3/uL (ref 150–400)
RBC: 4.93 MIL/uL (ref 4.22–5.81)
RDW: 12.2 % (ref 11.5–15.5)
WBC: 5.8 10*3/uL (ref 4.0–10.5)

## 2015-09-18 LAB — COMPREHENSIVE METABOLIC PANEL
ALK PHOS: 70 U/L (ref 38–126)
ALT: 14 U/L — ABNORMAL LOW (ref 17–63)
ANION GAP: 8 (ref 5–15)
AST: 19 U/L (ref 15–41)
Albumin: 4.1 g/dL (ref 3.5–5.0)
BUN: 14 mg/dL (ref 6–20)
CALCIUM: 9.6 mg/dL (ref 8.9–10.3)
CO2: 23 mmol/L (ref 22–32)
Chloride: 108 mmol/L (ref 101–111)
Creatinine, Ser: 1.21 mg/dL (ref 0.61–1.24)
GFR calc non Af Amer: >60 mL/min (ref >60)
Glucose, Bld: 68 mg/dL (ref 65–99)
POTASSIUM: 4.2 mmol/L (ref 3.5–5.1)
SODIUM: 139 mmol/L (ref 135–145)
Total Bilirubin: 2.1 mg/dL — ABNORMAL HIGH (ref 0.3–1.2)
Total Protein: 6.3 g/dL — ABNORMAL LOW (ref 6.5–8.1)

## 2015-09-18 NOTE — Progress Notes (Signed)
PROGRESS NOTE                                                                                                                                                                                                             Patient Demographics:    Jake Carr, is a 20 y.o. male, DOB - 10/22/1995, ZOX:[id number]  Admit date - 09/17/2015   Admitting Physician Starleen Armsawood S Elgergawy, MD  Outpatient Primary MD for the patient is Ezequiel KayserPERINI,MARK A, MD  LOS -   Outpatient Specialists:  Chief Complaint  Patient presents with  . epigastric pain   . Emesis       Brief Narrative   20 year old male presents with nausea, vomiting, abdominal pain, extensive workup by GI Dr. [name] as an outpatient showed suspicious for biliary dyskinesis, thought to be unlikely by general surgery.   Subjective:    Jake Carr today has, No headache, No chest pain,Has been able to tolerate liquid diet, advance to soft diet, has been tolerating well, reports of mild abdominal pain after breakfast.   Assessment  & Plan :    Active Problems:   Intractable vomiting   Biliary dyskinesia    intractable nausea, vomiting and abdominal pain - patient with extensive workup with GI Dr. [name] as an outpatient, no acute finding in CT abdomen and pelvis, or abdominal ultrasound, HIDA scan done  significant for low normal gallbladder ejection fraction of 35%, which raises a suspicion for biliary dyskinesis,Seen by general surgery, they thought his unlikely. - Tolerated clear liquid diet, so advanced to soft diet, which has been tolerating. - We'll continue with PPI as EGD showing evidence of mild esophagitis/gastritis - Patient was encouraged to stop usingTHC as it might be contributing to his symptoms. - Discussed with Dr. [name], plan for gastric empty study in a.m. to rule out gastroparesis  Hemoconcentration - Endo for hemoglobin 17.1 on admission, back to baseline with  hydration   Dehydration - Resolved   Code Status : Full  Family Communication  : Mother at bedside  Disposition Plan  : Home  Consults  :  Gen surgery  Procedures  : None  DVT Prophylaxis  :  Ambulatory/SCDs   Lab Results  Component Value Date   PLT 188 09/18/2015    Antibiotics  :    Anti-infectives    None  Objective:   Filed Vitals:   09/17/15 1838 09/17/15 2015 09/18/15 0445 09/18/15 1411  BP: 151/94 149/83 123/68 140/97  Pulse: 76 87 60 81  Temp: 98.3 F (36.8 C) 98.4 F (36.9 C) 98.2 F (36.8 C) 98.5 F (36.9 C)  TempSrc: Oral Oral Oral Oral  Resp: 15 18 18 18   Height:      Weight:      SpO2: 100% 99% 100% 100%    Wt Readings from Last 3 Encounters:  09/17/15 69.4 kg (153 lb) (46 %*, Z = -0.11)  09/15/15 70.761 kg (156 lb) (51 %*, Z = 0.02)  09/06/15 78.472 kg (173 lb) (74 %*, Z = 0.63)   * Growth percentiles are based on CDC 2-20 Years data.     Intake/Output Summary (Last 24 hours) at 09/18/15 1443 Last data filed at 09/18/15 1328  Gross per 24 hour  Intake 1428.33 ml  Output      0 ml  Net 1428.33 ml     Physical Exam  Awake Alert, Oriented X 3, No new F.N deficits, Normal affect [place].AT,PERRAL Supple Neck,No JVD, No cervical lymphadenopathy appriciated.  Symmetrical Chest wall movement, Good air movement bilaterally, CTAB RRR,No Gallops,Rubs or new Murmurs, No Parasternal Heave +ve B.Sounds, Abd Soft, No tenderness, No organomegaly appriciated, No rebound - guarding or rigidity. No Cyanosis, Clubbing or edema, No new Rash or bruise      Data Review:    CBC  Recent Labs Lab 09/15/15 1719 09/17/15 1255 09/18/15 0523  WBC 7.3 7.1 5.8  HGB 17.4* 17.1* 14.4  HCT 47.8 47.0 41.6  PLT 212 233 188  MCV 81.8 82.9 84.4  MCH 29.8 30.2 29.2  MCHC 36.4* 36.4* 34.6  RDW 12.2 12.2 12.2    Chemistries   Recent Labs Lab 09/15/15 1719 09/17/15 1255 09/18/15 0523  NA 138 139 139  K 3.3* 3.9 4.2  CL 105 109 108  CO2  23 19* 23  GLUCOSE 95 102* 68  BUN 18 14 14   CREATININE 1.30* 1.34* 1.21  CALCIUM 10.0 10.5* 9.6  AST 24 27 19   ALT 17 17 14*  ALKPHOS 83 84 70  BILITOT 1.5* 2.5* 2.1*   ------------------------------------------------------------------------------------------------------------------ No results for input(s): CHOL, HDL, LDLCALC, TRIG, CHOLHDL, LDLDIRECT in the last 72 hours.  No results found for: HGBA1C ------------------------------------------------------------------------------------------------------------------  Recent Labs  09/15/15 1723  TSH 0.746   ------------------------------------------------------------------------------------------------------------------ No results for input(s): VITAMINB12, FOLATE, FERRITIN, TIBC, IRON, RETICCTPCT in the last 72 hours.  Coagulation profile No results for input(s): INR, PROTIME in the last 168 hours.   Recent Labs  09/15/15 1719  DDIMER 0.35    Cardiac Enzymes  Recent Labs Lab 09/15/15 1719 09/15/15 2051  TROPONINI <0.03 <0.03   ------------------------------------------------------------------------------------------------------------------ No results found for: BNP  Inpatient Medications  Scheduled Meds: . pantoprazole  40 mg Oral Daily   Continuous Infusions: . sodium chloride 100 mL/hr at 09/18/15 0446   PRN Meds:.ondansetron **OR** ondansetron (ZOFRAN) IV  Micro Results No results found for this or any previous visit (from the past 240 hour(s)).  Radiology Reports Dg Chest 2 View  09/06/2015  CLINICAL DATA:  Cough, fever, sinus infection for 2 months EXAM: CHEST  2 VIEW COMPARISON:  11/16/2008 FINDINGS: Normal heart size, mediastinal contours, and pulmonary vascularity. Lungs clear. No pleural effusion or pneumothorax. Bones unremarkable. IMPRESSION: Normal exam. Electronically Signed   By: Ulyses Southward M.D.   On: 09/06/2015 10:50   US Abdomen Complete  09/12/2015  CLINICAL DATA:  Abdominal pain and  nausea for 2 weeks EXAM: ABDOMEN ULTRASOUND COMPLETE COMPARISON:  CT abdomen and pelvis September 07, 2015 FINDINGS: Gallbladder: No gallstones or wall thickening visualized. There is no pericholecystic fluid. No sonographic Murphy sign noted by sonographer. Common bile duct: Diameter: 3 mm. No intrahepatic, common hepatic, or common bile duct dilatation. Liver: No focal lesion identified. Within normal limits in parenchymal echogenicity. IVC: No abnormality visualized. Pancreas: No mass or inflammatory focus. Spleen: Size and appearance within normal limits. Right Kidney: Length: 10.7 cm. Echogenicity within normal limits. No mass or hydronephrosis visualized. Left Kidney: Length: 11.4 cm. Echogenicity within normal limits. No mass or hydronephrosis visualized. Abdominal aorta: No aneurysm visualized. Other findings: No demonstrable ascites. IMPRESSION: Study within normal limits. Electronically Signed   By: Bretta Bang III M.D.   On: 09/12/2015 15:39   Ct Abdomen Pelvis W Contrast  09/07/2015  CLINICAL DATA:  Epigastric pain with nausea and vomiting for 4 days EXAM: CT ABDOMEN AND PELVIS WITH CONTRAST TECHNIQUE: Multidetector CT imaging of the abdomen and pelvis was performed using the standard protocol following bolus administration of intravenous contrast. Oral contrast was also administered. CONTRAST:  ISOVUE-300 IOPAMIDOL (ISOVUE-300) INJECTION 61% COMPARISON:  Abdominal ultrasound November 22, 2008 FINDINGS: Lower chest:  Lung bases are clear. Hepatobiliary: No focal liver lesions are evident. Gallbladder wall is not appreciably thickened. There is no biliary duct dilatation. Pancreas: There is no pancreatic mass or inflammatory focus. Spleen: No adrenal lesions are evident. Adrenals/Urinary Tract: Adrenals appear normal bilaterally. Kidneys bilaterally show no appreciable mass or hydronephrosis on either side. There is no renal or ureteral calculus on either side. Urinary bladder is midline with  wall thickness within normal limits. Stomach/Bowel: There are scattered diverticula throughout the colon without diverticulitis. There is no bowel wall or mesenteric thickening. No bowel obstruction. No free air or portal venous air. Vascular/Lymphatic: There is no abdominal aortic aneurysm. No vascular lesions are evident. There is no appreciable adenopathy in the abdomen or pelvis. Reproductive: Prostate and seminal vesicles appear unremarkable. There is no pelvic mass or pelvic fluid collection. Other: Appendix appears normal. There is no abscess or ascites in the abdomen or pelvis. Musculoskeletal: There are no blastic or lytic bone lesions. There is no intramuscular or abdominal wall lesion. IMPRESSION: Scattered colonic diverticula without diverticulitis. There is no bowel wall thickening or bowel obstruction. No abscess. Appendix appears normal. No renal or ureteral calculus. No hydronephrosis. A cause for patient's symptoms has not been established with this study. Electronically Signed   By: Bretta Bang III M.D.   On: 09/07/2015 12:06   Nm Hepato W/eject Fract  09/16/2015  CLINICAL DATA:  Epigastric pain, nausea over the last 2 weeks EXAM: NUCLEAR MEDICINE HEPATOBILIARY IMAGING WITH GALLBLADDER EF TECHNIQUE: Sequential images of the abdomen were obtained out to 60 minutes following intravenous administration of radiopharmaceutical. After oral ingestion of Ensure, gallbladder ejection fraction was determined. At 60 min, normal ejection fraction is greater than 33%. RADIOPHARMACEUTICALS:  5.0 mCi Tc-22m  Choletec IV COMPARISON:  CT abdomen pelvis of 09/07/2015 and ultrasound of the abdomen of 09/07/2015 FINDINGS: Prompt uptake and biliary excretion of activity by the liver is seen. Gallbladder activity is visualized, consistent with patency of cystic duct. Biliary activity passes into small bowel, consistent with patent common bile duct. Calculated gallbladder ejection fraction is 35%. (Normal  gallbladder ejection fraction with Ensure is greater than 33%.) IMPRESSION: Normal nuclear medicine hepatobiliary scan. Low normal gallbladder ejection fraction of 35%. Electronically Signed  By: Dwyane Dee M.D.   On: 09/16/2015 11:39   Dg Abd Acute W/chest  09/15/2015  CLINICAL DATA:  Acute abdominal pain and vomiting for 2 weeks. EXAM: DG ABDOMEN ACUTE W/ 1V CHEST COMPARISON:  09/06/2015, 09/07/2015, 09/12/2015 FINDINGS: There is no evidence of dilated bowel loops or free intraperitoneal air. No radiopaque calculi or other significant radiographic abnormality is seen. Heart size and mediastinal contours are within normal limits. Both lungs are clear. Minor scoliosis of the thoracolumbar junction, convex left. IMPRESSION: Negative abdominal radiographs.  No acute cardiopulmonary disease. Electronically Signed   By: Judie Petit.  Shick M.D.   On: 09/15/2015 18:43    ELGERGAWY, DAWOOD M.D on 09/18/2015 at 2:43 PM  Between 7am to 7pm - Pager - [phone]  After 7pm go to [email or web address] - password [facility]  [facility] -  Office  [phone]

## 2015-09-18 NOTE — Consult Note (Signed)
Reason for Consult:epigastric pain, n/v Referring Physician: Dr [name] is an 20 y.o. male.  HPI: 49 yom who has three weeks of epigastric pain and n/v.  He was fine before this. [facility].  He has anxiety.  He has been smoking marijuana for the past couple months as well, most recent was 4-5 days ago.  He describes this as epigastric pain, actually made better when he gets up and walks around.  He states that he eats and then feels like it backs up in his chest.  He states overnight he drank some sprite and this felt like it backed up in his chest. He has been on antinausea meds mostly prn and protonix.  There is some relief with this.  He also has change in bms to where he is having some loose stools.  He has undergone nl [place], nl hida (his ef is normal) and a normal ct scan. Has egd that is fairly unremarkable. He was supposed to be seen in our office today for consideration of cholecystectomy.  He was admitted for dehydration.   Past Medical History  Diagnosis Date  . Gastritis     Past Surgical History  Procedure Laterality Date  . Esophagogastroduodenoscopy endoscopy      History reviewed. No pertinent family history.  Social History:  reports that he has never smoked. He does not have any smokeless tobacco history on file. He reports that he does not drink alcohol or use illicit drugs.he has recently used marijuana  Allergies: No Known Allergies  Medications: I have reviewed the patient's current medications.  Results for orders placed or performed during the hospital encounter of 09/17/15 (from the past 48 hour(s))  Lipase, blood     Status: None   Collection Time: 09/17/15 12:55 PM  Result Value Ref Range   Lipase 27 11 - 51 U/L  Comprehensive metabolic panel     Status: Abnormal   Collection Time: 09/17/15 12:55 PM  Result Value Ref Range   Sodium 139 135 - 145 mmol/L   Potassium 3.9 3.5 - 5.1 mmol/L   Chloride  109 101 - 111 mmol/L   CO2 19 (L) 22 - 32 mmol/L   Glucose, Bld 102 (H) 65 - 99 mg/dL   BUN 14 6 - 20 mg/dL   Creatinine, Ser 1.34 (H) 0.61 - 1.24 mg/dL   Calcium 10.5 (H) 8.9 - 10.3 mg/dL   Total Protein 7.9 6.5 - 8.1 g/dL   Albumin 5.3 (H) 3.5 - 5.0 g/dL   AST 27 15 - 41 U/L   ALT 17 17 - 63 U/L   Alkaline Phosphatase 84 38 - 126 U/L   Total Bilirubin 2.5 (H) 0.3 - 1.2 mg/dL   GFR calc non Af Amer >60 >60 mL/min   GFR calc Af Amer >60 >60 mL/min    Comment: (NOTE) The eGFR has been calculated using the CKD EPI equation. This calculation has not been validated in all clinical situations. eGFR's persistently <60 mL/min signify possible Chronic Kidney Disease.    Anion gap 11 5 - 15  CBC     Status: Abnormal   Collection Time: 09/17/15 12:55 PM  Result Value Ref Range   WBC 7.1 4.0 - 10.5 K/uL   RBC 5.67 4.22 - 5.81 MIL/uL   Hemoglobin 17.1 (H) 13.0 - 17.0 g/dL   HCT 47.0 39.0 - 52.0 %   MCV 82.9 78.0 - 100.0 fL  MCH 30.2 26.0 - 34.0 pg   MCHC 36.4 (H) 30.0 - 36.0 g/dL   RDW 12.2 11.5 - 15.5 %   Platelets 233 150 - 400 K/uL  Urinalysis, Routine w reflex microscopic     Status: Abnormal   Collection Time: 09/17/15  3:52 PM  Result Value Ref Range   Color, Urine YELLOW YELLOW   APPearance TURBID (A) CLEAR   Specific Gravity, Urine 1.029 1.005 - 1.030   pH 6.5 5.0 - 8.0   Glucose, UA NEGATIVE NEGATIVE mg/dL   Hgb urine dipstick NEGATIVE NEGATIVE   Bilirubin Urine SMALL (A) NEGATIVE   Ketones, ur >80 (A) NEGATIVE mg/dL   Protein, ur NEGATIVE NEGATIVE mg/dL   Nitrite NEGATIVE NEGATIVE   Leukocytes, UA NEGATIVE NEGATIVE  Urine microscopic-add on     Status: Abnormal   Collection Time: 09/17/15  3:52 PM  Result Value Ref Range   Squamous Epithelial / LPF 0-5 (A) NONE SEEN   WBC, UA NONE SEEN 0 - 5 WBC/hpf   RBC / HPF 0-5 0 - 5 RBC/hpf   Bacteria, UA FEW (A) NONE SEEN   Urine-Other AMORPHOUS URATES/PHOSPHATES   CBC     Status: None   Collection Time: 09/18/15  5:23 AM   Result Value Ref Range   WBC 5.8 4.0 - 10.5 K/uL   RBC 4.93 4.22 - 5.81 MIL/uL   Hemoglobin 14.4 13.0 - 17.0 g/dL   HCT 41.6 39.0 - 52.0 %   MCV 84.4 78.0 - 100.0 fL   MCH 29.2 26.0 - 34.0 pg   MCHC 34.6 30.0 - 36.0 g/dL   RDW 12.2 11.5 - 15.5 %   Platelets 188 150 - 400 K/uL  Comprehensive metabolic panel     Status: Abnormal   Collection Time: 09/18/15  5:23 AM  Result Value Ref Range   Sodium 139 135 - 145 mmol/L   Potassium 4.2 3.5 - 5.1 mmol/L   Chloride 108 101 - 111 mmol/L   CO2 23 22 - 32 mmol/L   Glucose, Bld 68 65 - 99 mg/dL   BUN 14 6 - 20 mg/dL   Creatinine, Ser 1.21 0.61 - 1.24 mg/dL   Calcium 9.6 8.9 - 10.3 mg/dL   Total Protein 6.3 (L) 6.5 - 8.1 g/dL   Albumin 4.1 3.5 - 5.0 g/dL   AST 19 15 - 41 U/L   ALT 14 (L) 17 - 63 U/L   Alkaline Phosphatase 70 38 - 126 U/L   Total Bilirubin 2.1 (H) 0.3 - 1.2 mg/dL   GFR calc non Af Amer >60 >60 mL/min   GFR calc Af Amer >60 >60 mL/min    Comment: (NOTE) The eGFR has been calculated using the CKD EPI equation. This calculation has not been validated in all clinical situations. eGFR's persistently <60 mL/min signify possible Chronic Kidney Disease.    Anion gap 8 5 - 15    Nm Hepato W/eject Fract  09/16/2015  CLINICAL DATA:  Epigastric pain, nausea over the last 2 weeks EXAM: NUCLEAR MEDICINE HEPATOBILIARY IMAGING WITH GALLBLADDER EF TECHNIQUE: Sequential images of the abdomen were obtained out to 60 minutes following intravenous administration of radiopharmaceutical. After oral ingestion of Ensure, gallbladder ejection fraction was determined. At 60 min, normal ejection fraction is greater than 33%. RADIOPHARMACEUTICALS:  5.0 mCi Tc-71m Choletec IV COMPARISON:  CT abdomen pelvis of 09/07/2015 and ultrasound of the abdomen of 09/07/2015 FINDINGS: Prompt uptake and biliary excretion of activity by the liver is seen. Gallbladder activity  is visualized, consistent with patency of cystic duct. Biliary activity passes into  small bowel, consistent with patent common bile duct. Calculated gallbladder ejection fraction is 35%. (Normal gallbladder ejection fraction with Ensure is greater than 33%.) IMPRESSION: Normal nuclear medicine hepatobiliary scan. Low normal gallbladder ejection fraction of 35%. Electronically Signed   By: Ivar Drape M.D.   On: 09/16/2015 11:39    Review of Systems  Constitutional: Positive for chills. Negative for fever.  Respiratory: Negative for shortness of breath.   Gastrointestinal: Positive for nausea, vomiting, abdominal pain and diarrhea. Negative for blood in stool.   Blood pressure 123/68, pulse 60, temperature 98.2 F (36.8 C), temperature source Oral, resp. rate 18, height _0  (1.702 m), weight 69.4 kg (153 lb), SpO2 100 %. Physical Exam  Vitals reviewed. Constitutional: He is oriented to person, place, and time. He appears well-developed and well-nourished.  Eyes: No scleral icterus.  Neck: Neck supple.  Cardiovascular: Normal rate, regular rhythm and normal heart sounds.   Respiratory: Effort normal and breath sounds normal. He has no wheezes. He has no rales.  GI: Soft. Bowel sounds are normal. There is no tenderness.  Neurological: He is alert and oriented to person, place, and time.    Assessment/Plan: N/v, abd pain  I do not think historically this is the gallbladder and all the available tests are normal.  Testing can be normal and this still be the gallbladder but I am not convinced at this point.  Surgery is certainly an option but this would be the last option after all other therapies are exhausted.  I think at best 50/50 chance this will make him better and this likely is less than that.  I discussed this with he and his parents today.  I think he needs to stay off marijuana. Would consider scheduled antiemetics, small meals and ensuring hydration. I think it would also be a good idea to evaluate him for gastroparesis or consider a promotility agent as the next  step.  I am happy to see him back in my office in a few weeks to see how he is doing.    Jake Carr 09/18/2015, 8:28 AM

## 2015-09-19 ENCOUNTER — Observation Stay (HOSPITAL_COMMUNITY): Payer: 59

## 2015-09-19 DIAGNOSIS — R1110 Vomiting, unspecified: Secondary | ICD-10-CM | POA: Diagnosis not present

## 2015-09-19 MED ORDER — PANTOPRAZOLE SODIUM 40 MG PO TBEC: 40.0000 mg | DELAYED_RELEASE_TABLET | Freq: Every day | ORAL | Status: DC

## 2015-09-19 MED ORDER — TECHNETIUM TC 99M SULFUR COLLOID
2.0000 | Freq: Once | INTRAVENOUS | Status: AC | PRN
Start: 2015-09-19 — End: 2015-09-19
  Administered 2015-09-19: 2 via INTRAVENOUS

## 2015-09-19 NOTE — Discharge Summary (Signed)
Jake Carr, is a 20 y.o. male  DOB 12/06/1995  MRN [id number].  Admission date:  09/17/2015  Admitting Physician  Starleen Arms, MD  Discharge Date:  09/19/2015   Primary MD  Ezequiel Kayser, MD  Recommendations for primary care physician for things to follow:  - Please check CBC, BMP daily next visit.   Admission Diagnosis  Dehydration [E86.0] Epigastric pain [R10.13] Intractable vomiting with nausea, vomiting of unspecified type [R11.10]   Discharge Diagnosis  Dehydration [E86.0] Epigastric pain [R10.13] Intractable vomiting with nausea, vomiting of unspecified type [R11.10]    Active Problems:   Intractable vomiting   Biliary dyskinesia      Past Medical History  Diagnosis Date  . Gastritis     Past Surgical History  Procedure Laterality Date  . Esophagogastroduodenoscopy endoscopy         History of present illness and  Hospital Course:     Kindly see H&P for history of present illness and admission details, please review complete Labs, Consult reports and Test reports for all details in brief  HPI  from the history and physical done on the day of admission 09/17/2015  Jake Carr is a 20 y.o. male, with significant past medical history of anxiety, sent by GI Dr. Kenna Gilbert office for evaluation of nausea, vomiting and abdominal pain, patient reports that symptoms started about last 3 weeks, pigastric abdominal pain, postprandial, accompanied by nausea and vomiting, endoscopy done at Dr Loreta Ave office for significant for mild esophagitis and gastritis, CT abdomen and pelvis significant for mild diverticulosis, no acute finding in right upper quadrant ultrasound, patient had HIDA scan on 09/16/2015 significant for Low normal gallbladder ejection fraction of 35%, supposed to follow up with [facility] Dr. Andrey Campanile tomorrow for biliary dyskinesis evaluation, giving significant  symptom, he could not wait till tomorrow. - in ED workup significant for slight increase in his creatinine, and hemoconcentration with glycohemoglobin of 17.1, orthostatics is as heart rate increased from 68-97 on standing.  Hospital Course  20 year old male presents with nausea, vomiting, abdominal pain, extensive workup by GI Dr. Loreta Ave as an outpatient showed suspicious for biliary dyskinesis, thought to be unlikely by general surgery, has normal gastric empty study, his symptoms are most likely related to avoid hyperemesis syndrome, versus anxiety  intractable nausea, vomiting and abdominal pain - patient with extensive workup with GI Dr. Loreta Ave as an outpatient, no acute finding in CT abdomen and pelvis, or abdominal ultrasound, HIDA scan done significant for low normal gallbladder ejection fraction of 35%, which raises a suspicion for biliary dyskinesis,Seen by general surgery, they thought this is unlikely, patient had gastric empty study to rule out gastroparesis, the study was within normal limits, patient has been tolerating soft diet during hospital stay, the encouraged to eat smaller portion, more frequent meals, and to stay hydrated. - He was encouraged to stay off any points use as it's very likely contributing to his symptoms, as well patient with significant anxiety, and this can be contributing to his symptoms as well.  Hemoconcentration - Endo for hemoglobin 17.1 on admission, back to baseline with hydration   Dehydration - Resolved  Discharge Condition:  stable   Follow UP  Follow-up Information    Follow up with Ezequiel Kayser, MD.   Specialty:  Internal Medicine   Contact information:   [address] [place] [address] [phone]         Discharge Instructions  and  Discharge Medications    Discharge Instructions    Discharge instructions    Complete by:  As directed   ollow with Primary MD Rodrigo Ran A, MD in 7 days   Get CBC, CMP,  checked  by  Primary MD next visit.     Disposition Home    Diet: Regular diet,  please have  smaller portions, but more frequent meals, try to stay hydrated with water and Gatorade   On your next visit with your primary care physician please Get Medicines reviewed and adjusted.   Please request your Prim.MD to go over all Hospital Tests and Procedure/Radiological results at the follow up, please get all Hospital records sent to your Prim MD by signing hospital release before you go home.   If you experience worsening of your admission symptoms, develop shortness of breath, life threatening emergency, suicidal or homicidal thoughts you must seek medical attention immediately by calling 911 or calling your MD immediately  if symptoms less severe.  You Must read complete instructions/literature along with all the possible adverse reactions/side effects for all the Medicines you take and that have been prescribed to you. Take any new Medicines after you have completely understood and accpet all the possible adverse reactions/side effects.   Do not drive, operating heavy machinery, perform activities at heights, swimming or participation in water activities or provide baby sitting services if your were admitted for syncope or siezures until you have seen by Primary MD or a Neurologist and advised to do so again.  Do not drive when taking Pain medications.    Do not take more than prescribed Pain, Sleep and Anxiety Medications  Special Instructions: If you have smoked or chewed Tobacco  in the last 2 yrs please stop smoking, stop any regular Alcohol  and or any Recreational drug use.  Wear Seat belts while driving.   Please note  You were cared for by a hospitalist during your hospital stay. If you have any questions about your discharge medications or the care you received while you were in the hospital after you are discharged, you can call the unit and asked to speak with the hospitalist on call  if the hospitalist that took care of you is not available. Once you are discharged, your primary care physician will handle any further medical issues. Please note that NO REFILLS for any discharge medications will be authorized once you are discharged, as it is imperative that you return to your primary care physician (or establish a relationship with a primary care physician if you do not have one) for your aftercare needs so that they can reassess your need for medications and monitor your lab values.            Medication List    STOP taking these medications        doxycycline 100 MG capsule  Commonly known as:  VIBRAMYCIN     LORazepam 1 MG tablet  Commonly known as:  ATIVAN     ondansetron 8 MG disintegrating tablet  Commonly known as:  ZOFRAN-ODT  ULTRAFLORA IMMUNE HEALTH PO      TAKE these medications        fluticasone 50 MCG/ACT nasal spray  Commonly known as:  FLONASE  Place 1 spray into both nostrils daily as needed for allergies or rhinitis.     pantoprazole 40 MG tablet  Commonly known as:  PROTONIX  Take 1 tablet (40 mg total) by mouth daily.          Diet and Activity recommendation: See Discharge Instructions above   Consults obtained -  General surgery   Major procedures and Radiology Reports - PLEASE review detailed and final reports for all details, in brief -     Dg Chest 2 View  09/06/2015  CLINICAL DATA:  Cough, fever, sinus infection for 2 months EXAM: CHEST  2 VIEW COMPARISON:  11/16/2008 FINDINGS: Normal heart size, mediastinal contours, and pulmonary vascularity. Lungs clear. No pleural effusion or pneumothorax. Bones unremarkable. IMPRESSION: Normal exam. Electronically Signed   By: Ulyses SouthwardMark  Boles M.D.   On: 09/06/2015 10:50   Nm Gastric Emptying  09/19/2015  CLINICAL DATA:  Nausea, vomiting, epigastric abdominal pain. EXAM: NUCLEAR MEDICINE GASTRIC EMPTYING SCAN TECHNIQUE: After oral ingestion of radiolabeled meal, sequential abdominal  images were obtained for 120 minutes. Residual percentage of activity remaining within the stomach was calculated at 60 and 120 minutes. RADIOPHARMACEUTICALS:  2.1 mCi Tc-561m MDP labeled sulfur colloid orally COMPARISON:  None. FINDINGS: Expected location of the stomach in the left upper quadrant. Ingested meal empties the stomach gradually over the course of the study with 5.4% retention at 60 min and 4.3% retention at 120 min (normal retention less than 30% at a 120 min). IMPRESSION: Normal gastric emptying study. Electronically Signed   By: Lupita RaiderJames  Green Jr, M.D.   On: 09/19/2015 15:47   [place] Abdomen Complete  09/12/2015  CLINICAL DATA:  Abdominal pain and nausea for 2 weeks EXAM: ABDOMEN ULTRASOUND COMPLETE COMPARISON:  CT abdomen and pelvis September 07, 2015 FINDINGS: Gallbladder: No gallstones or wall thickening visualized. There is no pericholecystic fluid. No sonographic Murphy sign noted by sonographer. Common bile duct: Diameter: 3 mm. No intrahepatic, common hepatic, or common bile duct dilatation. Liver: No focal lesion identified. Within normal limits in parenchymal echogenicity. IVC: No abnormality visualized. Pancreas: No mass or inflammatory focus. Spleen: Size and appearance within normal limits. Right Kidney: Length: 10.7 cm. Echogenicity within normal limits. No mass or hydronephrosis visualized. Left Kidney: Length: 11.4 cm. Echogenicity within normal limits. No mass or hydronephrosis visualized. Abdominal aorta: No aneurysm visualized. Other findings: No demonstrable ascites. IMPRESSION: Study within normal limits. Electronically Signed   By: Bretta BangWilliam  Woodruff III M.D.   On: 09/12/2015 15:39   Ct Abdomen Pelvis W Contrast  09/07/2015  CLINICAL DATA:  Epigastric pain with nausea and vomiting for 4 days EXAM: CT ABDOMEN AND PELVIS WITH CONTRAST TECHNIQUE: Multidetector CT imaging of the abdomen and pelvis was performed using the standard protocol following bolus administration of intravenous  contrast. Oral contrast was also administered. CONTRAST:  100mL ISOVUE-300 IOPAMIDOL (ISOVUE-300) INJECTION 61% COMPARISON:  Abdominal ultrasound November 22, 2008 FINDINGS: Lower chest:  Lung bases are clear. Hepatobiliary: No focal liver lesions are evident. Gallbladder wall is not appreciably thickened. There is no biliary duct dilatation. Pancreas: There is no pancreatic mass or inflammatory focus. Spleen: No adrenal lesions are evident. Adrenals/Urinary Tract: Adrenals appear normal bilaterally. Kidneys bilaterally show no appreciable mass or hydronephrosis on either side. There is no renal or ureteral calculus on either side. Urinary  bladder is midline with wall thickness within normal limits. Stomach/Bowel: There are scattered diverticula throughout the colon without diverticulitis. There is no bowel wall or mesenteric thickening. No bowel obstruction. No free air or portal venous air. Vascular/Lymphatic: There is no abdominal aortic aneurysm. No vascular lesions are evident. There is no appreciable adenopathy in the abdomen or pelvis. Reproductive: Prostate and seminal vesicles appear unremarkable. There is no pelvic mass or pelvic fluid collection. Other: Appendix appears normal. There is no abscess or ascites in the abdomen or pelvis. Musculoskeletal: There are no blastic or lytic bone lesions. There is no intramuscular or abdominal wall lesion. IMPRESSION: Scattered colonic diverticula without diverticulitis. There is no bowel wall thickening or bowel obstruction. No abscess. Appendix appears normal. No renal or ureteral calculus. No hydronephrosis. A cause for patient's symptoms has not been established with this study. Electronically Signed   By: Bretta Bang III M.D.   On: 09/07/2015 12:06   Nm Hepato W/eject Fract  09/16/2015  CLINICAL DATA:  Epigastric pain, nausea over the last 2 weeks EXAM: NUCLEAR MEDICINE HEPATOBILIARY IMAGING WITH GALLBLADDER EF TECHNIQUE: Sequential images of the  abdomen were obtained out to 60 minutes following intravenous administration of radiopharmaceutical. After oral ingestion of Ensure, gallbladder ejection fraction was determined. At 60 min, normal ejection fraction is greater than 33%. RADIOPHARMACEUTICALS:  5.0 mCi Tc-58m  Choletec IV COMPARISON:  CT abdomen pelvis of 09/07/2015 and ultrasound of the abdomen of 09/07/2015 FINDINGS: Prompt uptake and biliary excretion of activity by the liver is seen. Gallbladder activity is visualized, consistent with patency of cystic duct. Biliary activity passes into small bowel, consistent with patent common bile duct. Calculated gallbladder ejection fraction is 35%. (Normal gallbladder ejection fraction with Ensure is greater than 33%.) IMPRESSION: Normal nuclear medicine hepatobiliary scan. Low normal gallbladder ejection fraction of 35%. Electronically Signed   By: Dwyane Dee M.D.   On: 09/16/2015 11:39   Dg Abd Acute W/chest  09/15/2015  CLINICAL DATA:  Acute abdominal pain and vomiting for 2 weeks. EXAM: DG ABDOMEN ACUTE W/ 1V CHEST COMPARISON:  09/06/2015, 09/07/2015, 09/12/2015 FINDINGS: There is no evidence of dilated bowel loops or free intraperitoneal air. No radiopaque calculi or other significant radiographic abnormality is seen. Heart size and mediastinal contours are within normal limits. Both lungs are clear. Minor scoliosis of the thoracolumbar junction, convex left. IMPRESSION: Negative abdominal radiographs.  No acute cardiopulmonary disease. Electronically Signed   By: Judie Petit.  Shick M.D.   On: 09/15/2015 18:43    Micro Results    No results found for this or any previous visit (from the past 240 hour(s)).     Today   Subjective:   Hattie Lafrance today has no headache,no chest or abdominal pain,no new weakness tingling or numbness, feels much better wants to go home today.   Objective:   Blood pressure 123/95, pulse 59, temperature 97.7 F (36.5 C), temperature source Oral, resp. rate  18, height 5\' 7"  (1.702 m), weight 69.4 kg (153 lb), SpO2 100 %.   Intake/Output Summary (Last 24 hours) at 09/19/15 1636 Last data filed at 09/19/15 1300  Gross per 24 hour  Intake    220 ml  Output    360 ml  Net   -140 ml    Exam Awake Alert, Oriented x 3, No new F.N deficits, Normal affect [place].AT,PERRAL Supple Neck,No JVD, No cervical lymphadenopathy appriciated.  Symmetrical Chest wall movement, Good air movement bilaterally, CTAB RRR,No Gallops,Rubs or new Murmurs, No Parasternal Heave +ve B.Sounds,  Abd Soft, Non tender, No organomegaly appriciated, No rebound -guarding or rigidity. No Cyanosis, Clubbing or edema, No new Rash or bruise  Data Review   CBC w Diff: Lab Results  Component Value Date   WBC 5.8 09/18/2015   HGB 14.4 09/18/2015   HCT 41.6 09/18/2015   PLT 188 09/18/2015   LYMPHOPCT 26 09/07/2015   MONOPCT 8 09/07/2015   EOSPCT 0 09/07/2015   BASOPCT 0 09/07/2015    CMP: Lab Results  Component Value Date   NA 139 09/18/2015   K 4.2 09/18/2015   CL 108 09/18/2015   CO2 23 09/18/2015   BUN 14 09/18/2015   CREATININE 1.21 09/18/2015   PROT 6.3* 09/18/2015   ALBUMIN 4.1 09/18/2015   BILITOT 2.1* 09/18/2015   ALKPHOS 70 09/18/2015   AST 19 09/18/2015   ALT 14* 09/18/2015  .   Total Time in preparing paper work, data evaluation and todays exam - 35 minutes  Nyree Yonker M.D on 09/19/2015 at 4:36 PM  [facility]   Office  [phone]

## 2015-09-19 NOTE — Discharge Instructions (Signed)
Follow with Primary MD Ezequiel KayserPERINI,MARK A, MD in 7 days   Get CBC, CMP,  checked  by Primary MD next visit.     Disposition Home    Diet: Regular diet,  please have  smaller portions, but more frequent meals, try to stay hydrated with water and Gatorade   On your next visit with your primary care physician please Get Medicines reviewed and adjusted.   Please request your Prim.MD to go over all Hospital Tests and Procedure/Radiological results at the follow up, please get all Hospital records sent to your Prim MD by signing hospital release before you go home.   If you experience worsening of your admission symptoms, develop shortness of breath, life threatening emergency, suicidal or homicidal thoughts you must seek medical attention immediately by calling 911 or calling your MD immediately  if symptoms less severe.  You Must read complete instructions/literature along with all the possible adverse reactions/side effects for all the Medicines you take and that have been prescribed to you. Take any new Medicines after you have completely understood and accpet all the possible adverse reactions/side effects.   Do not drive, operating heavy machinery, perform activities at heights, swimming or participation in water activities or provide baby sitting services if your were admitted for syncope or siezures until you have seen by Primary MD or a Neurologist and advised to do so again.  Do not drive when taking Pain medications.    Do not take more than prescribed Pain, Sleep and Anxiety Medications  Special Instructions: If you have smoked or chewed Tobacco  in the last 2 yrs please stop smoking, stop any regular Alcohol  and or any Recreational drug use.  Wear Seat belts while driving.   Please note  You were cared for by a hospitalist during your hospital stay. If you have any questions about your discharge medications or the care you received while you were in the hospital after you are  discharged, you can call the unit and asked to speak with the hospitalist on call if the hospitalist that took care of you is not available. Once you are discharged, your primary care physician will handle any further medical issues. Please note that NO REFILLS for any discharge medications will be authorized once you are discharged, as it is imperative that you return to your primary care physician (or establish a relationship with a primary care physician if you do not have one) for your aftercare needs so that they can reassess your need for medications and monitor your lab values.

## 2015-09-19 NOTE — Progress Notes (Signed)
Discussed discharge summary with patient and his parents. Reviewed all medications with patient. Patient received Rx. Patient ready for discharge.

## 2015-09-29 ENCOUNTER — Encounter (HOSPITAL_COMMUNITY): Payer: 59

## 2015-09-29 ENCOUNTER — Ambulatory Visit (HOSPITAL_COMMUNITY): Payer: 59

## 2016-03-29 DIAGNOSIS — K5790 Diverticulosis of intestine, part unspecified, without perforation or abscess without bleeding: Secondary | ICD-10-CM | POA: Diagnosis not present

## 2016-03-29 DIAGNOSIS — K2970 Gastritis, unspecified, without bleeding: Secondary | ICD-10-CM | POA: Diagnosis not present

## 2016-03-29 DIAGNOSIS — J300 Vasomotor rhinitis: Secondary | ICD-10-CM | POA: Diagnosis not present

## 2016-05-14 DIAGNOSIS — R350 Frequency of micturition: Secondary | ICD-10-CM | POA: Diagnosis not present

## 2016-05-14 DIAGNOSIS — R300 Dysuria: Secondary | ICD-10-CM | POA: Diagnosis not present

## 2016-05-20 DIAGNOSIS — L700 Acne vulgaris: Secondary | ICD-10-CM | POA: Diagnosis not present

## 2016-05-20 DIAGNOSIS — L723 Sebaceous cyst: Secondary | ICD-10-CM | POA: Diagnosis not present

## 2016-05-26 DIAGNOSIS — R808 Other proteinuria: Secondary | ICD-10-CM | POA: Diagnosis not present

## 2016-07-12 DIAGNOSIS — J300 Vasomotor rhinitis: Secondary | ICD-10-CM | POA: Diagnosis not present

## 2016-08-23 DIAGNOSIS — Z79899 Other long term (current) drug therapy: Secondary | ICD-10-CM | POA: Diagnosis not present

## 2016-12-10 DIAGNOSIS — Z23 Encounter for immunization: Secondary | ICD-10-CM | POA: Diagnosis not present

## 2017-09-26 DIAGNOSIS — Z6824 Body mass index (BMI) 24.0-24.9, adult: Secondary | ICD-10-CM | POA: Diagnosis not present

## 2017-09-27 IMAGING — CT CT ABD-PELV W/ CM
2 of 4 series · 15 of 46 positions shown, 17 images · IV contrast (iopamidol)
Comparison: Abdominal ultrasound November 22, 2008

CLINICAL DATA: Epigastric pain with nausea and vomiting for 4 days

EXAM:
CT ABDOMEN AND PELVIS WITH CONTRAST
TECHNIQUE: Multidetector CT imaging of the abdomen and pelvis was performed
using the standard protocol following bolus administration of
intravenous contrast. Oral contrast was also administered.
CONTRAST:  100mL [id number] IOPAMIDOL ([id number]) INJECTION 61%

[Series 2: routine abd pel with · axial · 0.64mm/px · z∈[-517,-67]mm · 12 of 100 slices shown, 14 images]
[im 5/100  soft-tissue]
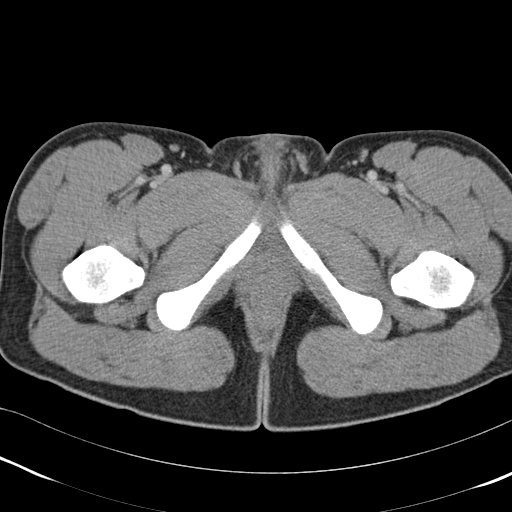
[im 5/100  bone]
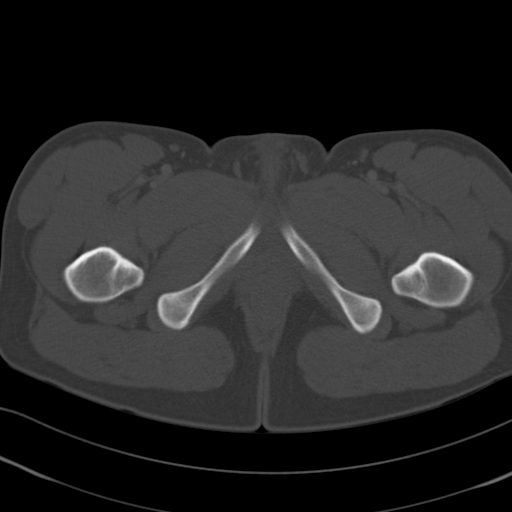
[im 13/100  soft-tissue]
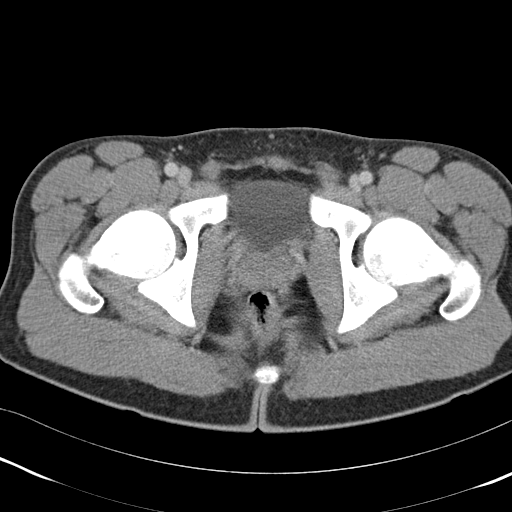
[im 22/100  soft-tissue]
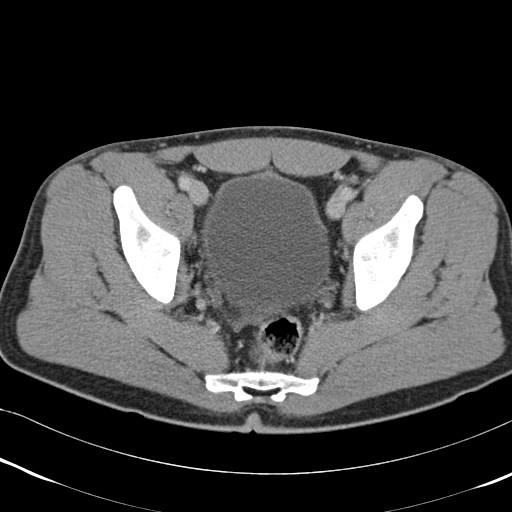
[im 31/100  soft-tissue]
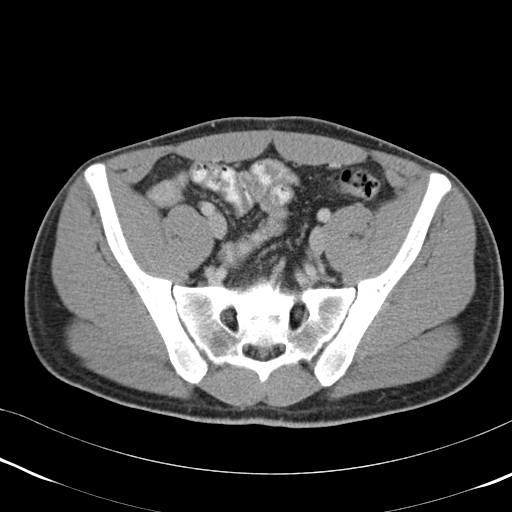
[im 39/100  soft-tissue]
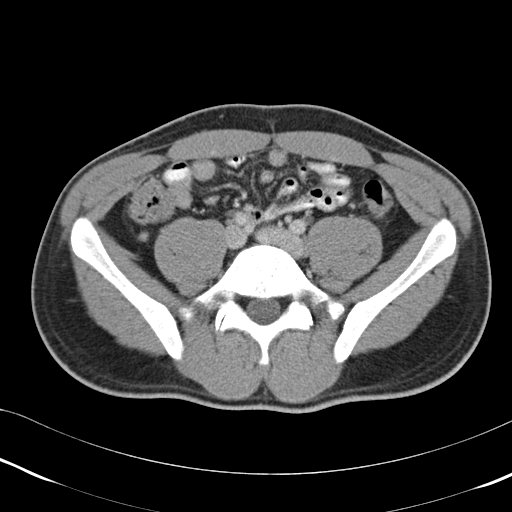
[im 48/100  soft-tissue]
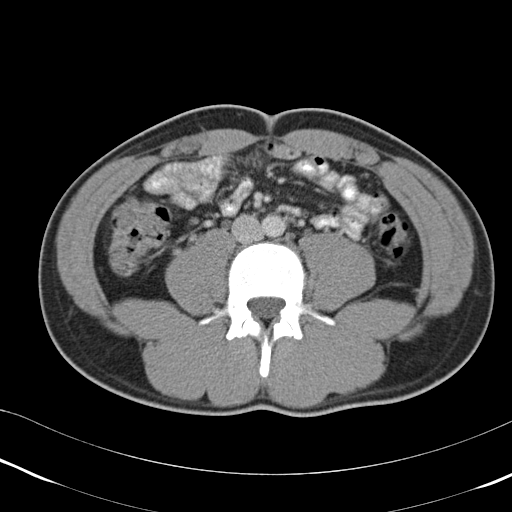
[im 52/100  soft-tissue]
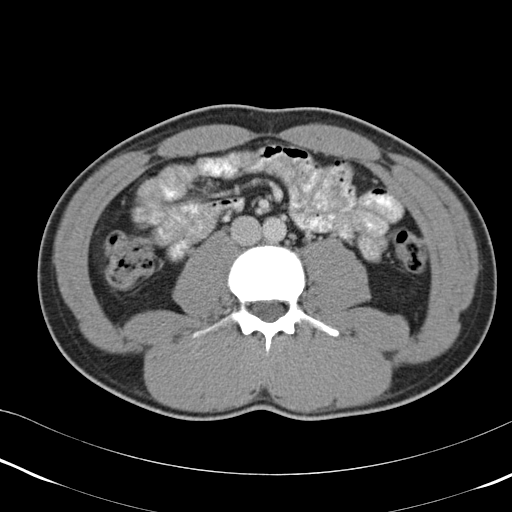
[im 61/100  soft-tissue]
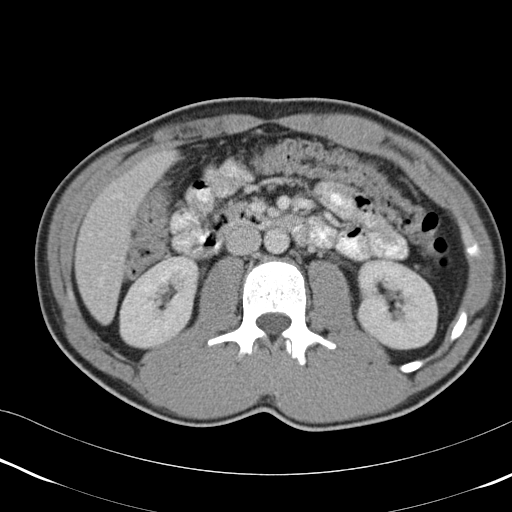
[im 69/100  soft-tissue]
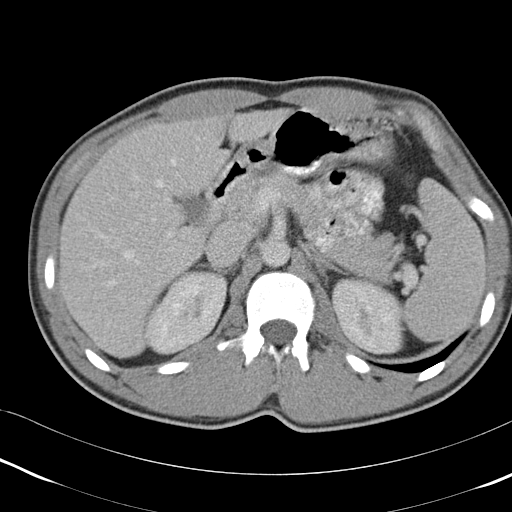
[im 69/100  bone]
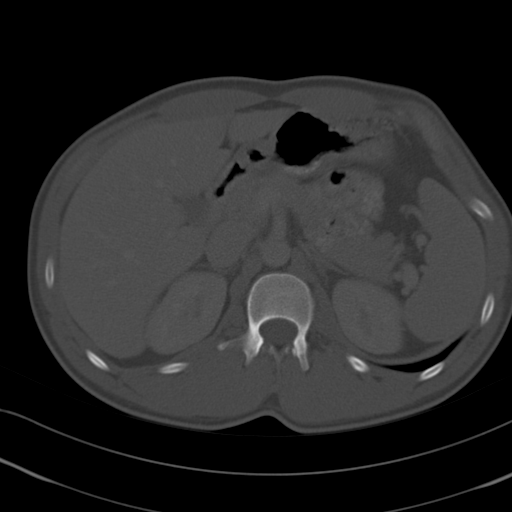
[im 78/100  soft-tissue]
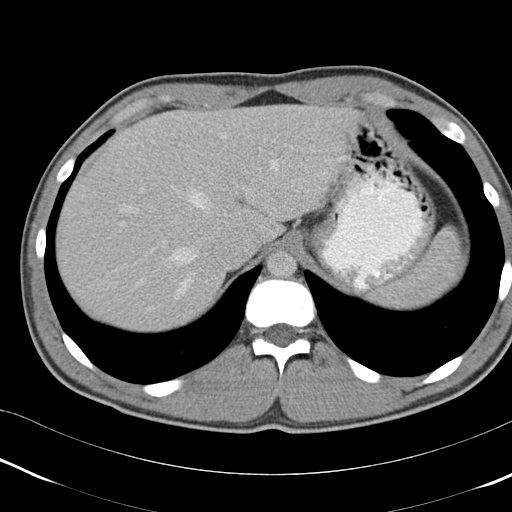
[im 87/100  soft-tissue]
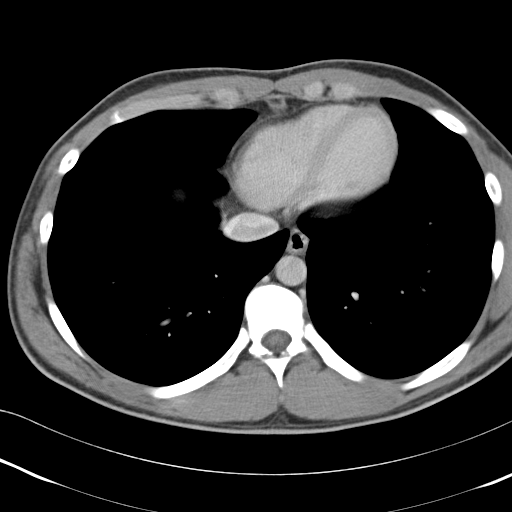
[im 95/100  soft-tissue]
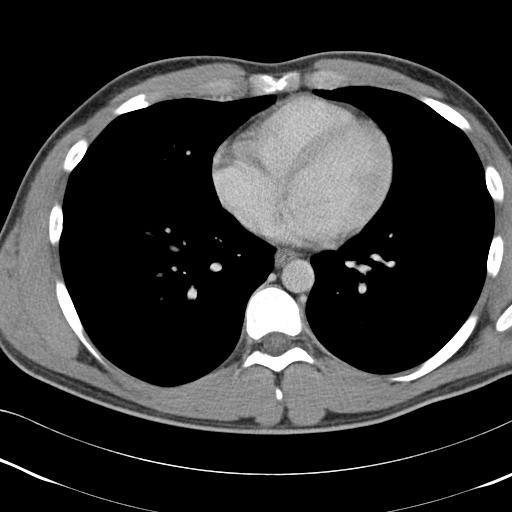

[Series 3: coronal · coronal · 0.65mm/px · 3 of 127 slices shown]
[im 43/127  soft-tissue]
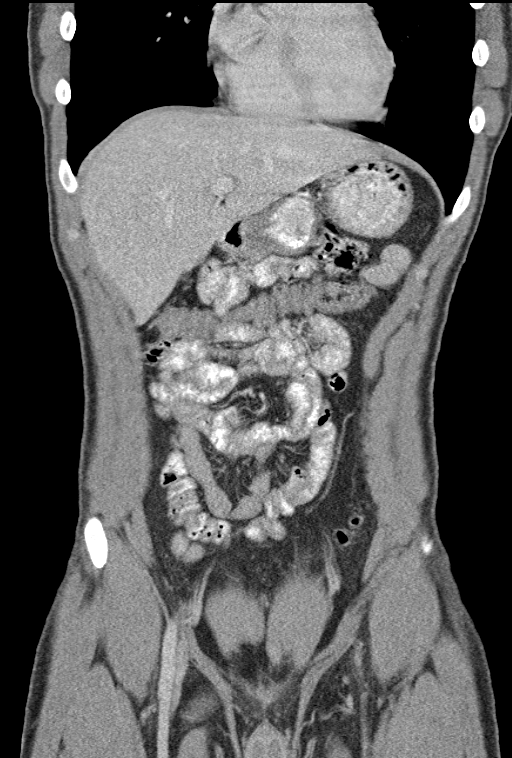
[im 57/127  soft-tissue]
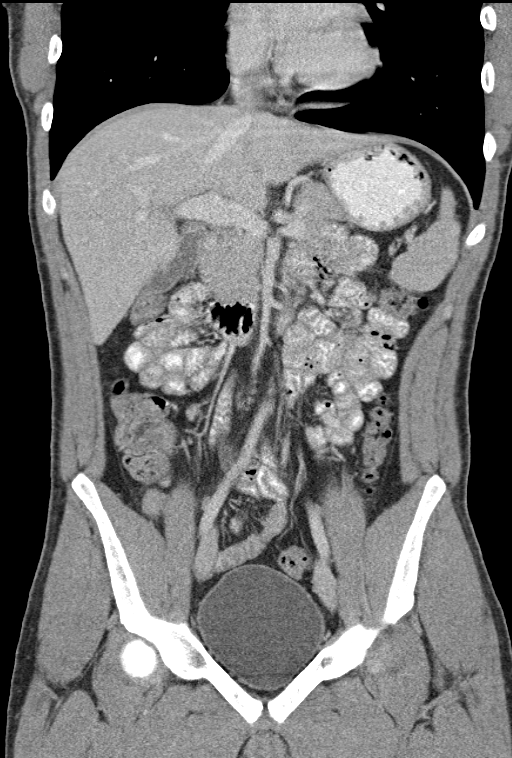
[im 71/127  soft-tissue]
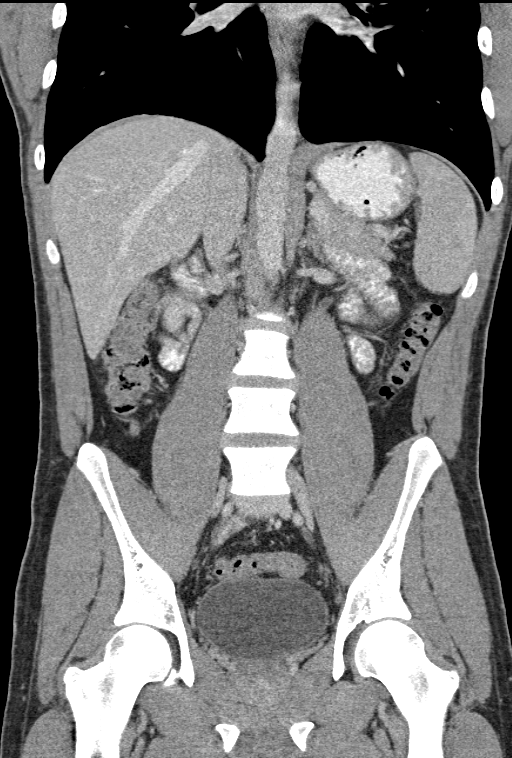

[15 of 46 positions shown; findings below may reference images not displayed]

FINDINGS: Lower chest:  Lung bases are clear.

Hepatobiliary: No focal liver lesions are evident. Gallbladder wall
is not appreciably thickened. There is no biliary duct dilatation.

Pancreas: There is no pancreatic mass or inflammatory focus.

Spleen: No adrenal lesions are evident.

Adrenals/Urinary Tract: Adrenals appear normal bilaterally. Kidneys
bilaterally show no appreciable mass or hydronephrosis on either
side. There is no renal or ureteral calculus on either side. Urinary
bladder is midline with wall thickness within normal limits.

Stomach/Bowel: There are scattered diverticula throughout the colon
without diverticulitis. There is no bowel wall or mesenteric
thickening. No bowel obstruction. No free air or portal venous air.

Vascular/Lymphatic: There is no abdominal aortic aneurysm. No
vascular lesions are evident. There is no appreciable adenopathy in
the abdomen or pelvis.

Reproductive: Prostate and seminal vesicles appear unremarkable.
There is no pelvic mass or pelvic fluid collection.

Other: Appendix appears normal. There is no abscess or ascites in
the abdomen or pelvis.

Musculoskeletal: There are no blastic or lytic bone lesions. There
is no intramuscular or abdominal wall lesion.
IMPRESSION: Scattered colonic diverticula without diverticulitis. There is no
bowel wall thickening or bowel obstruction. No abscess. Appendix
appears normal. No renal or ureteral calculus. No hydronephrosis. A
cause for patient's symptoms has not been established with this
study.

## 2019-10-07 ENCOUNTER — Other Ambulatory Visit: Payer: Self-pay

## 2019-10-07 ENCOUNTER — Ambulatory Visit
Admission: EM | Admit: 2019-10-07 | Discharge: 2019-10-07 | Disposition: A | Discharge status: Home / Self Care | Payer: BC Managed Care – PPO | Attending: Emergency Medicine | Admitting: Emergency Medicine

## 2019-10-07 ENCOUNTER — Encounter: Payer: Self-pay | Admitting: Emergency Medicine

## 2019-10-07 DIAGNOSIS — J0140 Acute pansinusitis, unspecified: Secondary | ICD-10-CM | POA: Diagnosis not present

## 2019-10-07 MED ORDER — AMOXICILLIN-POT CLAVULANATE 875-125 MG PO TABS: 1.0000 | ORAL_TABLET | Freq: Two times a day (BID) | ORAL | 0 refills | Status: AC

## 2019-10-07 NOTE — Discharge Instructions (Addendum)
COVID testing ordered.  It will take between 2-5 days for test results.  Someone will contact you regarding abnormal results.   Get plenty of rest and push fluids Augmentin for sinus infection.  Take as directed and to completion Use OTC zyrtec for nasal congestion, runny nose, and/or sore throat Use OTC flonase for nasal congestion and runny nose Use medications daily for symptom relief Use OTC medications like ibuprofen or tylenol as needed fever or pain Call or go to the ED if you have any new or worsening symptoms such as fever, cough, shortness of breath, chest tightness, chest pain, turning blue, changes in mental status, etc..Marland Kitchen

## 2019-10-07 NOTE — ED Provider Notes (Signed)
[facility]   [id number] 10/07/19 Arrival Time: 3532   CC: Sinus congestion  SUBJECTIVE: History from: patient.  Jake Carr is a 24 y.o. male who presents with runny nose, congestion, sinus pain, pressure, and sneezing x 1 week .  Denies sick exposure to COVID, flu or strep.  Received both COVID vaccines.  Has tried OTC medications without relief.  Denies aggravating factors.  Reports previous symptoms in the past with sinus infection.   Denies fever, chills, SOB, wheezing, chest pain, nausea, changes in bowel or bladder habits.    ROS: As per HPI.  All other pertinent ROS negative.     Past Medical History:  Diagnosis Date  . Gastritis    Past Surgical History:  Procedure Laterality Date  . ESOPHAGOGASTRODUODENOSCOPY ENDOSCOPY     No Known Allergies No current facility-administered medications on file prior to encounter.   Current Outpatient Medications on File Prior to Encounter  Medication Sig Dispense Refill  . fluticasone (FLONASE) 50 MCG/ACT nasal spray Place 1 spray into both nostrils daily as needed for allergies or rhinitis.    . pantoprazole (PROTONIX) 40 MG tablet Take 1 tablet (40 mg total) by mouth daily. 30 tablet 0   Social History   Socioeconomic History  . Marital status: Single    Spouse name: Not on file  . Number of children: Not on file  . Years of education: Not on file  . Highest education level: Not on file  Occupational History  . Not on file  Tobacco Use  . Smoking status: Never Smoker  Substance and Sexual Activity  . Alcohol use: No  . Drug use: No  . Sexual activity: Not on file  Other Topics Concern  . Not on file  Social History Narrative  . Not on file   Social Determinants of Health   Financial Resource Strain:   . Difficulty of Paying Living Expenses:   Food Insecurity:   . Worried About Programme researcher, broadcasting/film/video in the Last Year:   . Barista in the Last Year:   Transportation Needs:   . Automotive engineer (Medical):   Marland Kitchen Lack of Transportation (Non-Medical):   Physical Activity:   . Days of Exercise per Week:   . Minutes of Exercise per Session:   Stress:   . Feeling of Stress :   Social Connections:   . Frequency of Communication with Friends and Family:   . Frequency of Social Gatherings with Friends and Family:   . Attends Religious Services:   . Active Member of Clubs or Organizations:   . Attends Banker Meetings:   Marland Kitchen Marital Status:   Intimate Partner Violence:   . Fear of Current or Ex-Partner:   . Emotionally Abused:   Marland Kitchen Physically Abused:   . Sexually Abused:    No family history on file.  OBJECTIVE:  Vitals:   10/07/19 0832  BP: (!) 152/96  Pulse: (!) 58  Resp: 16  Temp: 97.6 F (36.4 C)  TempSrc: Oral  SpO2: 98%     General appearance: alert; appears mildly fatigued, but nontoxic; speaking in full sentences and tolerating own secretions HEENT: NCAT; Ears: EACs clear, TMs pearly gray; Eyes: PERRL.  EOM grossly intact. Sinuses: diffusely TTP; Nose: nares patent without rhinorrhea, Throat: oropharynx clear, tonsils non erythematous or enlarged, uvula midline  Neck: supple without LAD Lungs: unlabored respirations, symmetrical air entry; cough: absent; no respiratory distress; CTAB Heart: regular rate and rhythm.  Skin: warm and dry Psychological: alert and cooperative; normal mood and affect   ASSESSMENT & PLAN:  1. Acute non-recurrent pansinusitis     Meds ordered this encounter  Medications  . amoxicillin-clavulanate (AUGMENTIN) 875-125 MG tablet    Sig: Take 1 tablet by mouth every 12 (twelve) hours for 10 days.    Dispense:  20 tablet    Refill:  0    Order Specific Question:   Supervising Provider    Answer:   Jake Carr [[id number]]   COVID testing ordered.  It will take between 2-5 days for test results.  Someone will contact you regarding abnormal results.   Get plenty of rest and push fluids Augmentin for  sinus infection.  Take as directed and to completion Use OTC zyrtec for nasal congestion, runny nose, and/or sore throat Use OTC flonase for nasal congestion and runny nose Use medications daily for symptom relief Use OTC medications like ibuprofen or tylenol as needed fever or pain Call or go to the ED if you have any new or worsening symptoms such as fever, cough, shortness of breath, chest tightness, chest pain, turning blue, changes in mental status, etc...   Reviewed expectations re: course of current medical issues. Questions answered. Outlined signs and symptoms indicating need for more acute intervention. Patient verbalized understanding. After Visit Summary given.         Jake Chapel Horace, PA-C 10/07/19 [phone]

## 2019-10-07 NOTE — ED Triage Notes (Signed)
Provider triage  

## 2019-10-08 LAB — NOVEL CORONAVIRUS, NAA: SARS-CoV-2, NAA: DETECTED — AB

## 2019-10-08 LAB — SARS-COV-2, NAA 2 DAY TAT

## 2019-10-10 ENCOUNTER — Telehealth: Payer: Self-pay | Admitting: Emergency Medicine

## 2019-10-10 NOTE — Telephone Encounter (Signed)
Entered in error

## 2019-11-09 DIAGNOSIS — Z79899 Other long term (current) drug therapy: Secondary | ICD-10-CM | POA: Diagnosis not present

## 2019-11-09 DIAGNOSIS — R300 Dysuria: Secondary | ICD-10-CM | POA: Diagnosis not present

## 2019-11-09 DIAGNOSIS — Z0000 Encounter for general adult medical examination without abnormal findings: Secondary | ICD-10-CM | POA: Diagnosis not present

## 2019-11-16 DIAGNOSIS — Z0000 Encounter for general adult medical examination without abnormal findings: Secondary | ICD-10-CM | POA: Diagnosis not present

## 2019-11-16 DIAGNOSIS — Z23 Encounter for immunization: Secondary | ICD-10-CM | POA: Diagnosis not present

## 2019-11-16 DIAGNOSIS — F909 Attention-deficit hyperactivity disorder, unspecified type: Secondary | ICD-10-CM | POA: Diagnosis not present

## 2019-11-16 DIAGNOSIS — R809 Proteinuria, unspecified: Secondary | ICD-10-CM | POA: Diagnosis not present

## 2020-02-06 DIAGNOSIS — M89512 Osteolysis, left shoulder: Secondary | ICD-10-CM | POA: Diagnosis not present

## 2020-02-06 DIAGNOSIS — M25512 Pain in left shoulder: Secondary | ICD-10-CM | POA: Diagnosis not present

## 2020-11-27 DIAGNOSIS — Z0000 Encounter for general adult medical examination without abnormal findings: Secondary | ICD-10-CM | POA: Diagnosis not present

## 2020-12-04 DIAGNOSIS — R82998 Other abnormal findings in urine: Secondary | ICD-10-CM | POA: Diagnosis not present

## 2020-12-04 DIAGNOSIS — R809 Proteinuria, unspecified: Secondary | ICD-10-CM | POA: Diagnosis not present

## 2020-12-04 DIAGNOSIS — Z1331 Encounter for screening for depression: Secondary | ICD-10-CM | POA: Diagnosis not present

## 2020-12-04 DIAGNOSIS — Z23 Encounter for immunization: Secondary | ICD-10-CM | POA: Diagnosis not present

## 2020-12-04 DIAGNOSIS — L0292 Furuncle, unspecified: Secondary | ICD-10-CM | POA: Diagnosis not present

## 2020-12-04 DIAGNOSIS — Z79899 Other long term (current) drug therapy: Secondary | ICD-10-CM | POA: Diagnosis not present

## 2020-12-04 DIAGNOSIS — F909 Attention-deficit hyperactivity disorder, unspecified type: Secondary | ICD-10-CM | POA: Diagnosis not present
